# Patient Record
Sex: Female | Born: 1975
Health system: Southern US, Community
[De-identification: ages and names within clinical notes are randomized; demographics above are authoritative.]

## PROBLEM LIST (undated history)

## (undated) DIAGNOSIS — G43909 Migraine, unspecified, not intractable, without status migrainosus: Secondary | ICD-10-CM

## (undated) DIAGNOSIS — Z6828 Body mass index (BMI) 28.0-28.9, adult: Secondary | ICD-10-CM

## (undated) DIAGNOSIS — J302 Other seasonal allergic rhinitis: Secondary | ICD-10-CM

## (undated) HISTORY — DX: Other seasonal allergic rhinitis: J30.2

## (undated) HISTORY — PX: TUBAL LIGATION: SHX77

## (undated) HISTORY — DX: Body mass index (BMI) 28.0-28.9, adult: Z68.28

## (undated) HISTORY — DX: Migraine, unspecified, not intractable, without status migrainosus: G43.909

---

## 2018-04-21 NOTE — Progress Notes (Signed)
Complete Physical  Assessment and Plan:  Diagnoses and all orders for this visit:  Establishing care with new doctor, encounter for  Encounter for routine adult health examination with abnormal findings  Screening for diabetes mellitus -     Hemoglobin A1c  Screening cholesterol level -     Lipid panel  Screening for cardiovascular condition -     EKG 12-Lead  Screening for hematuria or proteinuria -     Urinalysis w microscopic + reflex cultur -     Microalbumin / creatinine urine ratio  Screening for thyroid disorder -     TSH  Vitamin D deficiency -     VITAMIN D 25 Hydroxy (Vit-D Deficiency, Fractures)  Medication management -     CBC with Differential/Platelet -     COMPLETE METABOLIC PANEL WITH GFR -     Urinalysis w microscopic + reflex cultur  Menorrhagia with regular cycle -     Ambulatory referral to Gynecology  Cervical cancer screening -     Ambulatory referral to Gynecology  Pain in thumb joint with movement, unspecified laterality -     meloxicam (MOBIC) 15 MG tablet; Take one daily with food for 2 weeks, can take with tylenol, can not take with aleve, iburpofen, then as needed daily for pain  Screening examination for pulmonary tuberculosis She understands to follow up for reading, if continued cough, hemoptysis will obtain CXR -     TB Skin Test  Need for prophylactic vaccination with tetanus-diphtheria (Td) -     Td : Tetanus/diphtheria >7yo Preservative  free  Tinea versicolor Suggested selsum blue body wash   Overweight (BMI 25.0-29.9) Long discussion about weight loss, diet, and exercise Recommended diet heavy in fruits and veggies and low in animal meats, cheeses, and dairy products, appropriate calorie intake Discussed appropriate weight for height  Follow up at next visit   Discussed med's effects and SE's. Screening labs and tests as requested with regular follow-up as recommended. Over 40 minutes of exam, counseling, chart review,  and complex, high level critical decision making was performed this visit.   Future Appointments  Date Time Provider Department Center  04/27/2019  3:00 PM Judd Gaudier, NP [facility] None    HPI  42 y.o. female, immigrated from [place], now is a citizen, married with 2 nearly grown children, Presents to establish care and for a complete physical. She has Menorrhagia; Tinea versicolor; Overweight (BMI 25.0-29.9); and Chronic thumb pain, bilateral on their problem list.   She reports seasonal allergies, typically during fall and winter for which she takes OTC medication PRN. She reports a mild cough that began this AM, and demonstrates a tissue with scant flegm and 2 spots of blood. She denies sore throat, fever/chills, congestion, dyspnea, chest pain, night sweats.   Hx of migraines, L sided, without aura but with photosensitivity, reports last was 3 months ago, have improved recently and typically well controlled by PRN ibuprofen 800 mg TID.    She has some problems with insomnia, difficulty falling asleep, will often take ~2 hours to fall asleep. 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 She has tried melatonin but has not tried benadryl.    She reports ongoing menorrhagia with regular cycle; reports historically had cycles lastin 10-12 days, recently more normal with 7 day cycle but very heavy bleeding x 4 days. Last GYN evaluation was remote.   BMI is Body mass index is 27.11 kg/m., she has been  working on diet and exercise, she avoids sweets, cut sugar out of coffee, watching portions. No weight goal, just wants to lose weight. No alcohol intake.  Wt Readings from Last 3 Encounters:  04/22/18 134 lb 3.2 oz (60.9 kg)   Today their BP is BP: 104/64 She does workout. She denies chest pain, shortness of breath, dizziness.     Current Medications:  Current Outpatient Medications on File Prior to Visit  Medication Sig Dispense Refill  . BIOTIN PO  Take by mouth. Takes 2 gummies daily.    . Multiple Vitamins-Minerals (CENTRUM PO) Take by mouth. Takes 2 daily    . multivitamin-lutein (OCUVITE-LUTEIN) CAPS capsule Take 1 capsule by mouth daily.    . Omega-3 Fatty Acids (OMEGA-3 FISH OIL PO) Take 1,040 mg by mouth daily.    Marland Kitchen Ubiquinol (QUNOL COQ10/UBIQUINOL/MEGA) 100 MG CAPS Take 1 capsule by mouth daily.     No current facility-administered medications on file prior to visit.    Allergies:  Allergies not on file Medical History:  She has Menorrhagia; Tinea versicolor; Overweight (BMI 25.0-29.9); and Chronic thumb pain, bilateral on their problem list. Health Maintenance:   Immunization History  Administered Date(s) Administered  . PPD Test 04/22/2018  . Td 04/22/2018    Tetanus: ? 2009,  Pneumovax: -  Flu vaccine: 2019  LMP: Patient's last menstrual period was 04/07/2018 (exact date). Pap: Remote MGM: never   Colonoscopy: never EGD:  Last Dental Exam: Last 2017 Last Eye Exam: Last 2017  Patient Care Team: Lucky Cowboy, MD as PCP - General (Internal Medicine)  Surgical History:  She has a past surgical history that includes Cesarean section and Tubal ligation (Bilateral). Family History:  Herfamily history includes Anemia in her mother; Diabetes in her father, paternal grandfather, and paternal grandmother; Heart disease in her father; Kidney Stones in her father; Migraines in her mother; Obesity in her father. Social History:  She reports that she has never smoked. She has never used smokeless tobacco. She reports that she does not drink alcohol or use drugs.  Review of Systems: Review of Systems  Constitutional: Negative for chills, fever, malaise/fatigue and weight loss.  HENT: Negative for congestion, hearing loss, nosebleeds, sinus pain, sore throat and tinnitus.   Eyes: Negative for blurred vision and double vision.  Respiratory: Positive for cough and hemoptysis (scant, started this AM). Negative for  sputum production, shortness of breath and wheezing.   Cardiovascular: Negative for chest pain, palpitations, orthopnea, claudication and leg swelling.  Gastrointestinal: Negative for abdominal pain, blood in stool, constipation, diarrhea, heartburn, melena, nausea and vomiting.  Genitourinary: Negative.   Musculoskeletal: Positive for joint pain (bilateral thumbs, bilateral knees). Negative for back pain, myalgias and neck pain.  Skin: Negative for rash.  Neurological: Negative for dizziness, tingling, sensory change, weakness and headaches.  Endo/Heme/Allergies: Positive for environmental allergies. Negative for polydipsia.  Psychiatric/Behavioral: Negative for depression, memory loss, substance abuse and suicidal ideas. The patient has insomnia. The patient is not nervous/anxious.   All other systems reviewed and are negative.   Physical Exam: Estimated body mass index is 27.11 kg/m as calculated from the following:   Height as of this encounter: 4\' 11"  (1.499 m).   Weight as of this encounter: 134 lb 3.2 oz (60.9 kg). BP 104/64   Pulse 64   Temp (!) 97.3 F (36.3 C)   Resp 16   Ht 4\' 11"  (1.499 m)   Wt 134 lb 3.2 oz (60.9 kg)   LMP 04/07/2018 (Exact Date)  BMI 27.11 kg/m  General Appearance: Well nourished, in no apparent distress.  Eyes: PERRLA, EOMs, conjunctiva no swelling or erythema, fundal exam deferred ENT/Mouth: Ext aud canals clear, normal light reflex with TMs without erythema, bulging. Good dentition. No erythema, swelling, or exudate on post pharynx. Tonsils not swollen or erythematous. Hearing normal.  Neck: Supple, thyroid normal. No bruits  Respiratory: Respiratory effort normal, BS with scant coarse bronchial sounds of R base without rales, rhonchi, wheezing or stridor.  Cardio: RRR without murmurs, rubs or gallops. Brisk peripheral pulses without edema.  Chest: symmetric, with normal excursions and percussion.  Breasts: Refer to GYN Abdomen: Soft, nontender,  no guarding, rebound, hernias, masses, or organomegaly.  Lymphatics: Non tender without lymphadenopathy.  Genitourinary: Defer, insufficient time, refer to GYN Musculoskeletal: Full ROM all peripheral extremities, bilateral knees with crepitus, no effusion, heat, laxity, 5/5 strength throughout, and normal gait.  Skin: Warm, dry without  Ecchymosis. She has scattered small round areas of skin without melanin light color contrasting brown skin) to bilateral hands/wrists/lower legs. Not raised, no discharge.  Neuro: Cranial nerves intact, reflexes equal bilaterally. Normal muscle tone, no cerebellar symptoms. Sensation intact.  Psych: Awake and oriented X 3, normal affect, Insight and Judgment appropriate.   EKG: WNL no ST changes.   Carlyon ShadowAshley C Dianna Ewald 6:03 PM [facility]

## 2018-04-22 ENCOUNTER — Ambulatory Visit (INDEPENDENT_AMBULATORY_CARE_PROVIDER_SITE_OTHER): Payer: BLUE CROSS/BLUE SHIELD | Admitting: Adult Health

## 2018-04-22 ENCOUNTER — Encounter: Payer: Self-pay | Admitting: Adult Health

## 2018-04-22 VITALS — BP 104/64 | HR 64 | Temp 97.3°F | Resp 16 | Ht 59.0 in | Wt 134.2 lb

## 2018-04-22 DIAGNOSIS — Z111 Encounter for screening for respiratory tuberculosis: Secondary | ICD-10-CM | POA: Diagnosis not present

## 2018-04-22 DIAGNOSIS — E559 Vitamin D deficiency, unspecified: Secondary | ICD-10-CM | POA: Diagnosis not present

## 2018-04-22 DIAGNOSIS — Z131 Encounter for screening for diabetes mellitus: Secondary | ICD-10-CM

## 2018-04-22 DIAGNOSIS — Z79899 Other long term (current) drug therapy: Secondary | ICD-10-CM

## 2018-04-22 DIAGNOSIS — M79645 Pain in left finger(s): Secondary | ICD-10-CM

## 2018-04-22 DIAGNOSIS — Z1389 Encounter for screening for other disorder: Secondary | ICD-10-CM | POA: Diagnosis not present

## 2018-04-22 DIAGNOSIS — N924 Excessive bleeding in the premenopausal period: Secondary | ICD-10-CM

## 2018-04-22 DIAGNOSIS — Z23 Encounter for immunization: Secondary | ICD-10-CM | POA: Diagnosis not present

## 2018-04-22 DIAGNOSIS — M79646 Pain in unspecified finger(s): Secondary | ICD-10-CM

## 2018-04-22 DIAGNOSIS — Z1329 Encounter for screening for other suspected endocrine disorder: Secondary | ICD-10-CM

## 2018-04-22 DIAGNOSIS — Z13220 Encounter for screening for lipoid disorders: Secondary | ICD-10-CM

## 2018-04-22 DIAGNOSIS — M25549 Pain in joints of unspecified hand: Secondary | ICD-10-CM

## 2018-04-22 DIAGNOSIS — G8929 Other chronic pain: Secondary | ICD-10-CM | POA: Insufficient documentation

## 2018-04-22 DIAGNOSIS — I10 Essential (primary) hypertension: Secondary | ICD-10-CM

## 2018-04-22 DIAGNOSIS — Z136 Encounter for screening for cardiovascular disorders: Secondary | ICD-10-CM | POA: Diagnosis not present

## 2018-04-22 DIAGNOSIS — E663 Overweight: Secondary | ICD-10-CM

## 2018-04-22 DIAGNOSIS — N920 Excessive and frequent menstruation with regular cycle: Secondary | ICD-10-CM | POA: Insufficient documentation

## 2018-04-22 DIAGNOSIS — Z0000 Encounter for general adult medical examination without abnormal findings: Secondary | ICD-10-CM

## 2018-04-22 DIAGNOSIS — Z7689 Persons encountering health services in other specified circumstances: Secondary | ICD-10-CM

## 2018-04-22 DIAGNOSIS — Z124 Encounter for screening for malignant neoplasm of cervix: Secondary | ICD-10-CM

## 2018-04-22 DIAGNOSIS — B360 Pityriasis versicolor: Secondary | ICD-10-CM | POA: Insufficient documentation

## 2018-04-22 DIAGNOSIS — M79644 Pain in right finger(s): Secondary | ICD-10-CM | POA: Insufficient documentation

## 2018-04-22 MED ORDER — MELOXICAM 15 MG PO TABS: ORAL_TABLET | ORAL | 1 refills | Status: DC

## 2018-04-22 NOTE — Patient Instructions (Addendum)
Try using selsun blue body wash or shampoo as body wash - this has medication in it to help with the skin fungus and typically is very helpful.    Tinea Versicolor Tinea versicolor is a common fungal infection of the skin. It causes a rash that appears as light or dark patches on the skin. The rash most often occurs on the chest, back, neck, or upper arms. This condition is more common during warm weather. Other than affecting how your skin looks, tinea versicolor usually does not cause other problems. In most cases, the infection goes away in a few weeks with treatment. It may take a few months for the patches on your skin to clear up. What are the causes? Tinea versicolor occurs when a type of fungus that is normally present on the skin starts to overgrow. This fungus is a kind of yeast. The exact cause of the overgrowth is not known. This condition cannot be passed from one person to another (noncontagious). What increases the risk? This condition is more likely to develop when certain factors are present, such as:  Heat and humidity.  Sweating too much.  Hormone changes.  Oily skin.  A weak defense (immune) system.  What are the signs or symptoms? Symptoms of this condition may include:  A rash on your skin that is made up of light or dark patches. The rash may have: ? Patches of tan or pink spots on light skin. ? Patches of white or brown spots on dark skin. ? Patches of skin that do not tan. ? Well-marked edges. ? Scales on the discolored areas.  Mild itching.  How is this diagnosed? A health care provider can usually diagnose this condition by looking at your skin. During the exam, he or she may use ultraviolet light to help determine the extent of the infection. In some cases, a skin sample may be taken by scraping the rash. This sample will be viewed under a microscope to check for yeast overgrowth. How is this treated? Treatment for this condition may  include:  Dandruff shampoo that is applied to the affected skin during showers or bathing.  Over-the-counter medicated skin cream, lotion, or soaps.  Prescription antifungal medicine in the form of skin cream or pills.  Medicine to help reduce itching.  Follow these instructions at home:  Take medicines only as directed by your health care provider.  Apply dandruff shampoo to the affected area if told to do so by your health care provider. You may be instructed to scrub the affected skin for several minutes each day.  Do not scratch the affected area of skin.  Avoid hot and humid conditions.  Do not use tanning booths.  Try to avoid sweating a lot. Contact a health care provider if:  Your symptoms get worse.  You have a fever.  You have redness, swelling, or pain at the site of your rash.  You have fluid, blood, or pus coming from your rash.  Your rash returns after treatment. This information is not intended to replace advice given to you by your health care provider. Make sure you discuss any questions you have with your health care provider. Document Released: 05/16/2000 Document Revised: 01/20/2016 Document Reviewed: 02/28/2014 Elsevier Interactive Patient Education  2018 [employer].   Meloxicam tablets What is this medicine? MELOXICAM (mel OX i cam) is a non-steroidal anti-inflammatory drug (NSAID). It is used to reduce swelling and to treat pain. It may be used for osteoarthritis, rheumatoid arthritis,  or juvenile rheumatoid arthritis. This medicine may be used for other purposes; ask your health care provider or pharmacist if you have questions. COMMON BRAND NAME(S): Mobic What should I tell my health care provider before I take this medicine? They need to know if you have any of these conditions: -bleeding disorders -cigarette smoker -coronary artery bypass graft (CABG) surgery within the past 2 weeks -drink more than 3 alcohol-containing drinks per  day -heart disease -high blood pressure -history of stomach bleeding -kidney disease -liver disease -lung or breathing disease, like asthma -stomach or intestine problems -an unusual or allergic reaction to meloxicam, aspirin, other NSAIDs, other medicines, foods, dyes, or preservatives -pregnant or trying to get pregnant -breast-feeding How should I use this medicine? Take this medicine by mouth with a full glass of water. Follow the directions on the prescription label. You can take it with or without food. If it upsets your stomach, take it with food. Take your medicine at regular intervals. Do not take it more often than directed. Do not stop taking except on your doctor's advice. A special MedGuide will be given to you by the pharmacist with each prescription and refill. Be sure to read this information carefully each time. Talk to your pediatrician regarding the use of this medicine in children. While this drug may be prescribed for selected conditions, precautions do apply. Patients over 41 years old may have a stronger reaction and need a smaller dose. Overdosage: If you think you have taken too much of this medicine contact a poison control center or emergency room at once. NOTE: This medicine is only for you. Do not share this medicine with others. What if I miss a dose? If you miss a dose, take it as soon as you can. If it is almost time for your next dose, take only that dose. Do not take double or extra doses. What may interact with this medicine? Do not take this medicine with any of the following medications: -cidofovir -ketorolac This medicine may also interact with the following medications: -aspirin and aspirin-like medicines -certain medicines for blood pressure, heart disease, irregular heart beat -certain medicines for depression, anxiety, or psychotic disturbances -certain medicines that treat or prevent blood clots like warfarin, enoxaparin, dalteparin, apixaban,  dabigatran, rivaroxaban -cyclosporine -digoxin -diuretics -methotrexate -other NSAIDs, medicines for pain and inflammation, like ibuprofen and naproxen -pemetrexed This list may not describe all possible interactions. Give your health care provider a list of all the medicines, herbs, non-prescription drugs, or dietary supplements you use. Also tell them if you smoke, drink alcohol, or use illegal drugs. Some items may interact with your medicine. What should I watch for while using this medicine? Tell your doctor or healthcare professional if your symptoms do not start to get better or if they get worse. Do not take other medicines that contain aspirin, ibuprofen, or naproxen with this medicine. Side effects such as stomach upset, nausea, or ulcers may be more likely to occur. Many medicines available without a prescription should not be taken with this medicine. This medicine can cause ulcers and bleeding in the stomach and intestines at any time during treatment. This can happen with no warning and may cause death. There is increased risk with taking this medicine for a long time. Smoking, drinking alcohol, older age, and poor health can also increase risks. Call your doctor right away if you have stomach pain or blood in your vomit or stool. This medicine does not prevent heart attack or stroke. In  fact, this medicine may increase the chance of a heart attack or stroke. The chance may increase with longer use of this medicine and in people who have heart disease. If you take aspirin to prevent heart attack or stroke, talk with your doctor or health care professional. What side effects may I notice from receiving this medicine? Side effects that you should report to your doctor or health care professional as soon as possible: -allergic reactions like skin rash, itching or hives, swelling of the face, lips, or tongue -nausea, vomiting -signs and symptoms of a blood clot such as breathing problems;  changes in vision; chest pain; severe, sudden headache; pain, swelling, warmth in the leg; trouble speaking; sudden numbness or weakness of the face, arm, or leg -signs and symptoms of bleeding such as bloody or black, tarry stools; red or dark-brown urine; spitting up blood or brown material that looks like coffee grounds; red spots on the skin; unusual bruising or bleeding from the eye, gums, or nose -signs and symptoms of liver injury like dark yellow or brown urine; general ill feeling or flu-like symptoms; light-colored stools; loss of appetite; nausea; right upper belly pain; unusually weak or tired; yellowing of the eyes or skin -signs and symptoms of stroke like changes in vision; confusion; trouble speaking or understanding; severe headaches; sudden numbness or weakness of the face, arm, or leg; trouble walking; dizziness; loss of balance or coordination Side effects that usually do not require medical attention (report to your doctor or health care professional if they continue or are bothersome): -constipation -diarrhea -gas This list may not describe all possible side effects. Call your doctor for medical advice about side effects. You may report side effects to FDA at 1-800-FDA-1088. Where should I keep my medicine? Keep out of the reach of children. Store at room temperature between 15 and 30 degrees C (59 and 86 degrees F). Throw away any unused medicine after the expiration date. NOTE: This sheet is a summary. It may not cover all possible information. If you have questions about this medicine, talk to your doctor, pharmacist, or health care provider.  2018 Elsevier/Gold Standard (2015-06-20 19:28:16)

## 2018-04-23 ENCOUNTER — Encounter: Payer: Self-pay | Admitting: Adult Health

## 2018-04-23 DIAGNOSIS — R7309 Other abnormal glucose: Secondary | ICD-10-CM | POA: Insufficient documentation

## 2018-04-23 DIAGNOSIS — E785 Hyperlipidemia, unspecified: Secondary | ICD-10-CM | POA: Insufficient documentation

## 2018-04-23 DIAGNOSIS — R7303 Prediabetes: Secondary | ICD-10-CM

## 2018-04-23 DIAGNOSIS — E559 Vitamin D deficiency, unspecified: Secondary | ICD-10-CM | POA: Insufficient documentation

## 2018-04-23 DIAGNOSIS — Z87898 Personal history of other specified conditions: Secondary | ICD-10-CM

## 2018-04-23 HISTORY — DX: Personal history of other specified conditions: Z87.898

## 2018-04-23 LAB — URINALYSIS W MICROSCOPIC + REFLEX CULTURE
Bacteria, UA: NONE SEEN /HPF
Bilirubin Urine: NEGATIVE
Glucose, UA: NEGATIVE
Hgb urine dipstick: NEGATIVE
Hyaline Cast: NONE SEEN /LPF
Ketones, ur: NEGATIVE
Leukocyte Esterase: NEGATIVE
NITRITES URINE, INITIAL: NEGATIVE
Protein, ur: NEGATIVE
SPECIFIC GRAVITY, URINE: 1.021 (ref 1.001–1.03)
Squamous Epithelial / LPF: NONE SEEN /HPF (ref <=5)
WBC UA: NONE SEEN /HPF (ref 0–5)
pH: 7 (ref 5.0–8.0)

## 2018-04-23 LAB — COMPLETE METABOLIC PANEL WITH GFR
AG Ratio: 1.2 (calc) (ref 1.0–2.5)
ALBUMIN MSPROF: 4.2 g/dL (ref 3.6–5.1)
ALKALINE PHOSPHATASE (APISO): 53 U/L (ref 33–115)
ALT: 10 U/L (ref 6–29)
AST: 15 U/L (ref 10–30)
BILIRUBIN TOTAL: 0.2 mg/dL (ref 0.2–1.2)
BUN: 11 mg/dL (ref 7–25)
CALCIUM: 9.3 mg/dL (ref 8.6–10.2)
CHLORIDE: 104 mmol/L (ref 98–110)
CO2: 26 mmol/L (ref 20–32)
CREATININE: 0.61 mg/dL (ref 0.50–1.10)
GFR, Est African American: 131 mL/min/{1.73_m2} (ref >=60)
GFR, Est Non African American: 113 mL/min/{1.73_m2} (ref >=60)
GLUCOSE: 85 mg/dL (ref 65–99)
Globulin: 3.4 g/dL (calc) (ref 1.9–3.7)
Potassium: 4.6 mmol/L (ref 3.5–5.3)
Sodium: 139 mmol/L (ref 135–146)
TOTAL PROTEIN: 7.6 g/dL (ref 6.1–8.1)

## 2018-04-23 LAB — CBC WITH DIFFERENTIAL/PLATELET
BASOS ABS: 73 {cells}/uL (ref 0–200)
BASOS PCT: 1 %
Eosinophils Absolute: 102 cells/uL (ref 15–500)
Eosinophils Relative: 1.4 %
HCT: 37.8 % (ref 35.0–45.0)
HEMOGLOBIN: 12.5 g/dL (ref 11.7–15.5)
Lymphs Abs: 2621 cells/uL (ref 850–3900)
MCH: 26.8 pg — ABNORMAL LOW (ref 27.0–33.0)
MCHC: 33.1 g/dL (ref 32.0–36.0)
MCV: 80.9 fL (ref 80.0–100.0)
MONOS PCT: 6 %
MPV: 10.9 fL (ref 7.5–12.5)
NEUTROS ABS: 4066 {cells}/uL (ref 1500–7800)
Neutrophils Relative %: 55.7 %
Platelets: 304 10*3/uL (ref 140–400)
RBC: 4.67 10*6/uL (ref 3.80–5.10)
RDW: 13 % (ref 11.0–15.0)
Total Lymphocyte: 35.9 %
WBC mixed population: 438 cells/uL (ref 200–950)
WBC: 7.3 10*3/uL (ref 3.8–10.8)

## 2018-04-23 LAB — LIPID PANEL
CHOL/HDL RATIO: 3.7 (calc) (ref <5.0)
CHOLESTEROL: 209 mg/dL — AB (ref <200)
HDL: 56 mg/dL (ref >50)
LDL Cholesterol (Calc): 124 mg/dL (calc) — ABNORMAL HIGH
Non-HDL Cholesterol (Calc): 153 mg/dL (calc) — ABNORMAL HIGH (ref <130)
Triglycerides: 172 mg/dL — ABNORMAL HIGH (ref <150)

## 2018-04-23 LAB — HEMOGLOBIN A1C
HEMOGLOBIN A1C: 5.8 %{Hb} — AB (ref <5.7)
Mean Plasma Glucose: 120 (calc)
eAG (mmol/L): 6.6 (calc)

## 2018-04-23 LAB — TSH: TSH: 2.19 mIU/L

## 2018-04-23 LAB — NO CULTURE INDICATED

## 2018-04-23 LAB — MICROALBUMIN / CREATININE URINE RATIO
CREATININE, URINE: 122 mg/dL (ref 20–275)
MICROALB UR: 1.1 mg/dL
Microalb Creat Ratio: 9 mcg/mg creat (ref <30)

## 2018-04-23 LAB — VITAMIN D 25 HYDROXY (VIT D DEFICIENCY, FRACTURES): VIT D 25 HYDROXY: 27 ng/mL — AB (ref 30–100)

## 2018-05-20 ENCOUNTER — Encounter: Payer: Self-pay | Admitting: Gynecology

## 2018-05-20 ENCOUNTER — Ambulatory Visit (INDEPENDENT_AMBULATORY_CARE_PROVIDER_SITE_OTHER): Payer: BLUE CROSS/BLUE SHIELD | Admitting: Gynecology

## 2018-05-20 VITALS — BP 110/62 | Ht <= 58 in | Wt 133.0 lb

## 2018-05-20 DIAGNOSIS — Z1151 Encounter for screening for human papillomavirus (HPV): Secondary | ICD-10-CM

## 2018-05-20 DIAGNOSIS — Z01419 Encounter for gynecological examination (general) (routine) without abnormal findings: Secondary | ICD-10-CM

## 2018-05-20 DIAGNOSIS — N920 Excessive and frequent menstruation with regular cycle: Secondary | ICD-10-CM

## 2018-05-20 NOTE — Patient Instructions (Addendum)
Follow-up for ultrasound as scheduled.   Call to Schedule your mammogram  Facilities in [place]: 1)  [facility]. [facility], [address] Phone: [phone] 2)  Dr. Bertrand at [facility]  [address] Phone: [phone]     Mammogram A mammogram is an X-ray test to find changes in a woman's breast. You should get a mammogram if:  You are 40 years of age or older  You have risk factors.   Your doctor recommends that you have one.  BEFORE THE TEST  Do not schedule the test the week before your period, especially if your breasts are sore during this time.  On the day of your mammogram:  Wash your breasts and armpits well. After washing, do not put on any deodorant or talcum powder on until after your test.   Eat and drink as you usually do.   Take your medicines as usual.   If you are diabetic and take insulin, make sure you:   Eat before coming for your test.   Take your insulin as usual.   If you cannot keep your appointment, call before the appointment to cancel. Schedule another appointment.  TEST  You will need to undress from the waist up. You will put on a hospital gown.   Your breast will be put on the mammogram machine, and it will press firmly on your breast with a piece of plastic called a compression paddle. This will make your breast flatter so that the machine can X-ray all parts of your breast.   Both breasts will be X-rayed. Each breast will be X-rayed from above and from the side. An X-ray might need to be taken again if the picture is not good enough.   The mammogram will last about 15 to 30 minutes.  AFTER THE TEST Finding out the results of your test Ask when your test results will be ready. Make sure you get your test results.  Document Released: 08/15/2008 Document Revised: 05/08/2011 Document Reviewed: 08/15/2008 ExitCare Patient Information 2012 [employer].   

## 2018-05-20 NOTE — Addendum Note (Signed)
Addended by: Dayna BarkerGARDNER, Keeghan Bialy K on: 05/20/2018 04:09 PM   Modules accepted: Orders

## 2018-05-20 NOTE — Progress Notes (Signed)
    Sherri Browneratthinee Anthony 19-Mar-1976 [id number]        42 y.o.  G2P2 new patient for annual gynecologic exam.  Is that her menses are heavy lasting 7 days with occasional bleedthrough episodes.  Are regular monthly without intermenstrual bleeding.  Have always seem to be this way.  Status post C-section x2 with BTL.  No history of abnormal Pap smears.  Last GYN checkup a number of years ago.  Past medical history,surgical history, problem list, medications, allergies, family history and social history were all reviewed and documented as reviewed in the EPIC chart.  ROS:  Performed with pertinent positives and negatives included in the history, assessment and plan.   Additional significant findings : None   Exam: Kennon PortelaKim Gardner assistant Vitals:   05/20/18 1518  Weight: 133 lb (60.3 kg)  Height: 4\' 10"  (1.473 m)   Body mass index is 27.8 kg/m. BP 110/62  General appearance:  Normal affect, orientation and appearance. Skin: Grossly normal HEENT: Without gross lesions.  No cervical or supraclavicular adenopathy. Thyroid normal.  Lungs:  Clear without wheezing, rales or rhonchi Cardiac: RR, without RMG Abdominal:  Soft, nontender, without masses, guarding, rebound, organomegaly or hernia Breasts:  Examined lying and sitting without masses, retractions, discharge or axillary adenopathy. Pelvic:  Ext, BUS, Vagina: Normal  Cervix: Normal.  Pap smear/HPV  Uterus: Retroverted, normal size, shape and contour, midline and mobile nontender   Adnexa: Without masses or tenderness    Anus and perineum: Normal   Rectovaginal: Normal sphincter tone without palpated masses or tenderness.    Assessment/Plan:  42 y.o. G2P2 female for annual gynecologic exam with regular menses, tubal sterilization.   1. Menorrhagia.  Exam is normal.  Discussed differential to include hormonal dysfunction, adenomyosis, structural such as small myomas or polyps.  Recommend start with sonohysterogram for better uterine  definition and endometrial assessment.  Various scenarios to include hormonal manipulation, Mirena IUD, hysteroscopic resection of defects encountered, endometrial ablation, hysterectomy all reviewed.  Will rediscuss after ultrasound.  Recent hemoglobin was 12 and a normal TSH. 2. Mammogram a number of years ago.  Recommend baseline mammogram now.  Names and numbers provided.  Breast exam normal today. 3. Pap smear a number of years ago.  Pap smear/HPV done today.  No history of abnormal Pap smears previously. 4. Health maintenance.  No routine lab work done as patient recently had this done at her primary physician's office.  Follow-up for sonohysterogram.  Follow-up in 1 year for annual exam.   Dara Lordsimothy P Fontaine MD, 4:00 PM 05/20/2018

## 2018-05-21 LAB — PAP IG AND HPV HIGH-RISK: HPV DNA HIGH RISK: NOT DETECTED

## 2018-07-01 ENCOUNTER — Other Ambulatory Visit: Payer: BLUE CROSS/BLUE SHIELD

## 2018-07-01 ENCOUNTER — Ambulatory Visit: Payer: BLUE CROSS/BLUE SHIELD | Admitting: Gynecology

## 2018-08-25 ENCOUNTER — Other Ambulatory Visit: Payer: Self-pay

## 2018-08-25 DIAGNOSIS — M25549 Pain in joints of unspecified hand: Secondary | ICD-10-CM

## 2018-08-25 MED ORDER — MELOXICAM 15 MG PO TABS: ORAL_TABLET | ORAL | 1 refills | Status: DC

## 2018-08-25 NOTE — Telephone Encounter (Signed)
Meloxicam refill request

## 2018-10-21 ENCOUNTER — Other Ambulatory Visit: Payer: Self-pay

## 2018-10-21 ENCOUNTER — Ambulatory Visit (INDEPENDENT_AMBULATORY_CARE_PROVIDER_SITE_OTHER): Payer: BLUE CROSS/BLUE SHIELD | Admitting: Adult Health

## 2018-10-21 ENCOUNTER — Encounter: Payer: Self-pay | Admitting: Adult Health

## 2018-10-21 VITALS — BP 110/70 | HR 81 | Temp 96.1°F | Wt 133.2 lb

## 2018-10-21 DIAGNOSIS — E785 Hyperlipidemia, unspecified: Secondary | ICD-10-CM | POA: Diagnosis not present

## 2018-10-21 DIAGNOSIS — Z79899 Other long term (current) drug therapy: Secondary | ICD-10-CM | POA: Diagnosis not present

## 2018-10-21 DIAGNOSIS — E663 Overweight: Secondary | ICD-10-CM

## 2018-10-21 DIAGNOSIS — D509 Iron deficiency anemia, unspecified: Secondary | ICD-10-CM

## 2018-10-21 DIAGNOSIS — R7309 Other abnormal glucose: Secondary | ICD-10-CM

## 2018-10-21 NOTE — Progress Notes (Signed)
FOLLOW UP  Assessment and Plan:   Sherri Anthony was seen today for follow-up.  Diagnoses and all orders for this visit:  Other abnormal glucose Discussed disease and risks Discussed diet/exercise, weight management  -     Hemoglobin A1c  Hyperlipidemia, unspecified hyperlipidemia type Mild elevations not requiring statin therapy; working on lifestyle Diet reviewed in detail today Continue low cholesterol diet and exercise.  Check lipid panel.  -     Lipid panel  Overweight (BMI 25.0-29.9) Long discussion about weight loss, diet, and exercise Recommended diet heavy in fruits and veggies and low in animal meats, cheeses, and dairy products, appropriate calorie intake Patient will work on watching rice portions, choose brown over white more frequently, try walking 15 min daily  Discussed appropriate weight for height  Follow up at next visit  Medication management -     CBC with Differential/Platelet -     COMPLETE METABOLIC PANEL WITH GFR  Iron deficiency anemia, unspecified iron deficiency anemia type Continue on iron supplement as directed; menorrhagia followed by GYN -     CBC with Differential/Platelet   Continue diet and meds as discussed. Further disposition pending results of labs. Discussed med's effects and SE's.   Over 30 minutes of exam, counseling, chart review, and critical decision making was performed.   Future Appointments  Date Time Provider Department Center  04/27/2019  3:00 PM Judd Gaudier, NP [facility] None    ----------------------------------------------------------------------------------------------------------------------  HPI 43 y.o. female, immigrant citizen from [place], works in [employer], presents accompanied by her husband for 6 month follow up on cholesterol, diabetes, weight and vitamin D deficiency.   She was seen at [facility] where anemia was noted; last CBC in our office was normal; she has been taking unknown iron 65 mg TID x 1  month, was advised 3 month duration. Denies dizziness, fatigue, though endorses occasionally feeling short of breath with exertion. She has menorrhagia following with GYN.   BMI is Body mass index is 27.84 kg/m., she has been working on diet, she reports she does 15 min of body weight and sit ups daily. She works a lot in her garden but doesn't walk intentionally.  Wt Readings from Last 3 Encounters:  10/21/18 133 lb 3.2 oz (60.4 kg)  05/20/18 133 lb (60.3 kg)  04/22/18 134 lb 3.2 oz (60.9 kg)    Today their BP is BP: 110/70  She does workout. She denies chest pain, shortness of breath, dizziness.   She is not on cholesterol medication, working on lifestyle, pushing vegetable intake, uses vegetable intake. She does eat all types of meat. Her cholesterol is not at goal. The cholesterol last visit was:   Lab Results  Component Value Date   CHOL 209 (H) 04/22/2018   HDL 56 04/22/2018   LDLCALC 124 (H) 04/22/2018   TRIG 172 (H) 04/22/2018   CHOLHDL 3.7 04/22/2018    She has been working on diet and exercise for prediabetes, and denies increased appetite, nausea, paresthesia of the feet, polydipsia, polyuria and visual disturbances. Last A1C in the office was:  Lab Results  Component Value Date   HGBA1C 5.8 (H) 04/22/2018    Patient is on Vitamin D supplement.   Lab Results  Component Value Date   VD25OH 27 (L) 04/22/2018         Current Medications:  Current Outpatient Medications on File Prior to Visit  Medication Sig  . BIOTIN PO Take by mouth. Takes 2 gummies daily.  . cyclobenzaprine (FLEXERIL) 5  MG tablet Take 5 mg by mouth 3 (three) times daily as needed for muscle spasms.  . Ferrous Sulfate (IRON) 325 (65 Fe) MG TABS Take by mouth 3 (three) times daily.  . meloxicam (MOBIC) 15 MG tablet Take one daily with food for 2 weeks, can take with tylenol, can not take with aleve, iburpofen, then as needed daily for pain  . Multiple Vitamins-Minerals (CENTRUM PO) Take by mouth.  Takes 2 daily  . multivitamin-lutein (OCUVITE-LUTEIN) CAPS capsule Take 1 capsule by mouth daily.  . Omega-3 Fatty Acids (OMEGA-3 FISH OIL PO) Take 1,040 mg by mouth daily.  Marland Kitchen. Ubiquinol (QUNOL COQ10/UBIQUINOL/MEGA) 100 MG CAPS Take 1 capsule by mouth daily.   No current facility-administered medications on file prior to visit.      Allergies: No Known Allergies   Medical History:  Past Medical History:  Diagnosis Date  . Migraines   . Seasonal allergies    Family history- Reviewed and unchanged Social history- Reviewed and unchanged   Review of Systems:  Review of Systems  Constitutional: Negative for malaise/fatigue and weight loss.  HENT: Negative for hearing loss and tinnitus.   Eyes: Negative for blurred vision and double vision.  Respiratory: Negative for cough, shortness of breath and wheezing.   Cardiovascular: Negative for chest pain, palpitations, orthopnea, claudication and leg swelling.  Gastrointestinal: Negative for abdominal pain, blood in stool, constipation, diarrhea, heartburn, melena, nausea and vomiting.  Genitourinary: Negative.   Musculoskeletal: Negative for joint pain and myalgias.  Skin: Negative for rash.  Neurological: Negative for dizziness, tingling, sensory change, weakness and headaches.  Endo/Heme/Allergies: Negative for polydipsia.  Psychiatric/Behavioral: Negative.   All other systems reviewed and are negative.    Physical Exam: BP 110/70   Pulse 81   Temp (!) 96.1 F (35.6 C)   Wt 133 lb 3.2 oz (60.4 kg)   LMP 10/16/2018   SpO2 98%   BMI 27.84 kg/m  Wt Readings from Last 3 Encounters:  10/21/18 133 lb 3.2 oz (60.4 kg)  05/20/18 133 lb (60.3 kg)  04/22/18 134 lb 3.2 oz (60.9 kg)   General Appearance: Well nourished, in no apparent distress. Eyes: PERRLA, EOMs, conjunctiva no swelling or erythema Sinuses: No Frontal/maxillary tenderness ENT/Mouth: Ext aud canals clear, TMs without erythema, bulging. No erythema, swelling, or  exudate on post pharynx.  Tonsils not swollen or erythematous. Hearing normal.  Neck: Supple, thyroid normal.  Respiratory: Respiratory effort normal, BS equal bilaterally without rales, rhonchi, wheezing or stridor.  Cardio: RRR with no MRGs. Brisk peripheral pulses without edema.  Abdomen: Soft, + BS.  Non tender, no guarding, rebound, hernias, masses. Lymphatics: Non tender without lymphadenopathy.  Musculoskeletal: Full ROM, 5/5 strength, Normal gait Skin: Warm, dry without rashes, lesions, ecchymosis.  Neuro: Cranial nerves intact. No cerebellar symptoms.  Psych: Awake and oriented X 3, normal affect, Insight and Judgment appropriate.    Dan MakerAshley C Jenice Leiner, NP 4:00 PM [facility]

## 2018-10-21 NOTE — Patient Instructions (Addendum)
Goals    . HEMOGLOBIN A1C < 5.7    . LDL CALC < 100      Lab Results  Component Value Date   CHOL 209 (H) 04/22/2018   HDL 56 04/22/2018   LDLCALC 124 (H) 04/22/2018   TRIG 172 (H) 04/22/2018   CHOLHDL 3.7 04/22/2018   Lab Results  Component Value Date   HGBA1C 5.8 (H) 04/22/2018     Preventing High Cholesterol Cholesterol is a waxy, fat-like substance that your body needs in small amounts. Your liver makes all the cholesterol that your body needs. Having high cholesterol (hypercholesterolemia) increases your risk for heart disease and stroke. Extra (excess) cholesterol comes from the food you eat, such as animal-based fat (saturated fat) from meat and some dairy products. High cholesterol can often be prevented with diet and lifestyle changes. If you already have high cholesterol, you can control it with diet and lifestyle changes, as well as medicine. What nutrition changes can be made?  Eat less saturated fat. Foods that contain saturated fat include red meat and some dairy products.  Avoid processed meats, like bacon and lunch meats.  Avoid trans fats, which are found in margarine and some baked goods.  Avoid foods and beverages that have added sugars.  Eat more fruits, vegetables, and whole grains.  Choose healthy sources of protein, such as fish, poultry, and nuts.  Choose healthy sources of fat, such as: ? Nuts. ? Vegetable oils, especially olive oil. ? Fish that have healthy fats (omega-3 fatty acids), such as mackerel or salmon. What lifestyle changes can be made?   Lose weight if you are overweight. Losing 5-10 lb (2.3-4.5 kg) can help prevent or control high cholesterol and reduce your risk for diabetes and high blood pressure. Ask your health care provider to help you with a diet and exercise plan to safely lose weight.  Get enough exercise. Do at least 150 minutes of moderate-intensity exercise each week. ? You could do this in short exercise sessions  several times a day, or you could do longer exercise sessions a few times a week. For example, you could take a brisk 10-minute walk or bike ride, 3 times a day, for 5 days a week.  Do not smoke. If you need help quitting, ask your health care provider.  Limit your alcohol intake. If you drink alcohol, limit alcohol intake to no more than 1 drink a day for nonpregnant women and 2 drinks a day for men. One drink equals 12 oz of beer, 5 oz of wine, or 1 oz of hard liquor. Why are these changes important?  If you have high cholesterol, deposits (plaques) may build up on the walls of your blood vessels. Plaques make the arteries narrower and stiffer, which can restrict or block blood flow and cause blood clots to form. This greatly increases your risk for heart attack and stroke. Making diet and lifestyle changes can reduce your risk for these life-threatening conditions. What can I do to lower my risk?  Manage your risk factors for high cholesterol. Talk with your health care provider about all of your risk factors and how to lower your risk.  Manage other conditions that you have, such as diabetes or high blood pressure (hypertension).  Have your cholesterol checked at regular intervals.  Keep all follow-up visits as told by your health care provider. This is important. How is this treated? In addition to diet and lifestyle changes, your health care provider may recommend medicines to  help lower cholesterol, such as a medicine to reduce the amount of cholesterol made in your liver. You may need medicine if:  Diet and lifestyle changes do not lower your cholesterol enough.  You have high cholesterol and other risk factors for heart disease or stroke. Take over-the-counter and prescription medicines only as told by your health care provider. Where to find more information  American Heart Association: 1122334455www.heart.org/HEARTORG/Conditions/Cholesterol/Cholesterol_UCM_001089_SubHomePage.jsp   National Heart, Lung, and Blood Institute: http://hood.com/www.nhlbi.nih.gov/health/resources/heart/heart-cholesterol-hbc-what-html Summary  High cholesterol increases your risk for heart disease and stroke. By keeping your cholesterol level low, you can reduce your risk for these conditions.  Diet and lifestyle changes are the most important steps in preventing high cholesterol.  Work with your health care provider to manage your risk factors, and have your blood tested regularly. This information is not intended to replace advice given to you by your health care provider. Make sure you discuss any questions you have with your health care provider. Document Released: 06/03/2015 Document Revised: 01/26/2016 Document Reviewed: 01/26/2016 Elsevier Interactive Patient Education  2019 Elsevier Inc.      Prediabetes Eating Plan Prediabetes is a condition that causes blood sugar (glucose) levels to be higher than normal. This increases the risk for developing diabetes. In order to prevent diabetes from developing, your health care provider may recommend a diet and other lifestyle changes to help you:  Control your blood glucose levels.  Improve your cholesterol levels.  Manage your blood pressure. Your health care provider may recommend working with a diet and nutrition specialist (dietitian) to make a meal plan that is best for you. What are tips for following this plan? Lifestyle  Set weight loss goals with the help of your health care team. It is recommended that most people with prediabetes lose 7% of their current body weight.  Exercise for at least 30 minutes at least 5 days a week.  Attend a support group or seek ongoing support from a mental health counselor.  Take over-the-counter and prescription medicines only as told by your health care provider. Reading food labels  Read food labels to check the amount of fat, salt (sodium), and sugar in prepackaged foods. Avoid foods that have: ?  Saturated fats. ? Trans fats. ? Added sugars.  Avoid foods that have more than 300 milligrams (mg) of sodium per serving. Limit your daily sodium intake to less than 2,300 mg each day. Shopping  Avoid buying pre-made and processed foods. Cooking  Cook with olive oil. Do not use butter, lard, or ghee.  Bake, broil, grill, or boil foods. Avoid frying. Meal planning   Work with your dietitian to develop an eating plan that is right for you. This may include: ? Tracking how many calories you take in. Use a food diary, notebook, or mobile application to track what you eat at each meal. ? Using the glycemic index (GI) to plan your meals. The index tells you how quickly a food will raise your blood glucose. Choose low-GI foods. These foods take a longer time to raise blood glucose.  Consider following a Mediterranean diet. This diet includes: ? Several servings each day of fresh fruits and vegetables. ? Eating fish at least twice a week. ? Several servings each day of whole grains, beans, nuts, and seeds. ? Using olive oil instead of other fats. ? Moderate alcohol consumption. ? Eating small amounts of red meat and whole-fat dairy.  If you have high blood pressure, you may need to limit your sodium intake or  follow a diet such as the DASH eating plan. DASH is an eating plan that aims to lower high blood pressure. What foods are recommended? The items listed below may not be a complete list. Talk with your dietitian about what dietary choices are best for you. Grains Whole grains, such as whole-wheat or whole-grain breads, crackers, cereals, and pasta. Unsweetened oatmeal. Bulgur. Barley. Quinoa. Brown rice. Corn or whole-wheat flour tortillas or taco shells. Vegetables Lettuce. Spinach. Peas. Beets. Cauliflower. Cabbage. Broccoli. Carrots. Tomatoes. Squash. Eggplant. Herbs. Peppers. Onions. Cucumbers. Brussels sprouts. Fruits Berries. Bananas. Apples. Oranges. Grapes. Papaya. Mango.  Pomegranate. Kiwi. Grapefruit. Cherries. Meats and other protein foods Seafood. Poultry without skin. Lean cuts of pork and beef. Tofu. Eggs. Nuts. Beans. Dairy Low-fat or fat-free dairy products, such as yogurt, cottage cheese, and cheese. Beverages Water. Tea. Coffee. Sugar-free or diet soda. Seltzer water. Lowfat or no-fat milk. Milk alternatives, such as soy or almond milk. Fats and oils Olive oil. Canola oil. Sunflower oil. Grapeseed oil. Avocado. Walnuts. Sweets and desserts Sugar-free or low-fat pudding. Sugar-free or low-fat ice cream and other frozen treats. Seasoning and other foods Herbs. Sodium-free spices. Mustard. Relish. Low-fat, low-sugar ketchup. Low-fat, low-sugar barbecue sauce. Low-fat or fat-free mayonnaise. What foods are not recommended? The items listed below may not be a complete list. Talk with your dietitian about what dietary choices are best for you. Grains Refined white flour and flour products, such as bread, pasta, snack foods, and cereals. Vegetables Canned vegetables. Frozen vegetables with butter or cream sauce. Fruits Fruits canned with syrup. Meats and other protein foods Fatty cuts of meat. Poultry with skin. Breaded or fried meat. Processed meats. Dairy Full-fat yogurt, cheese, or milk. Beverages Sweetened drinks, such as sweet iced tea and soda. Fats and oils Butter. Lard. Ghee. Sweets and desserts Baked goods, such as cake, cupcakes, pastries, cookies, and cheesecake. Seasoning and other foods Spice mixes with added salt. Ketchup. Barbecue sauce. Mayonnaise. Summary  To prevent diabetes from developing, you may need to make diet and other lifestyle changes to help control blood sugar, improve cholesterol levels, and manage your blood pressure.  Set weight loss goals with the help of your health care team. It is recommended that most people with prediabetes lose 7 percent of their current body weight.  Consider following a Mediterranean  diet that includes plenty of fresh fruits and vegetables, whole grains, beans, nuts, seeds, fish, lean meat, low-fat dairy, and healthy oils. This information is not intended to replace advice given to you by your health care provider. Make sure you discuss any questions you have with your health care provider. Document Released: 10/03/2014 Document Revised: 07/23/2016 Document Reviewed: 07/23/2016 Elsevier Interactive Patient Education  2019 [employer].

## 2018-10-22 ENCOUNTER — Encounter: Payer: Self-pay | Admitting: Adult Health

## 2018-10-22 LAB — CBC WITH DIFFERENTIAL/PLATELET
Absolute Monocytes: 333 cells/uL (ref 200–950)
Basophils Absolute: 68 cells/uL (ref 0–200)
Basophils Relative: 1 %
Eosinophils Absolute: 129 cells/uL (ref 15–500)
Eosinophils Relative: 1.9 %
HCT: 40.3 % (ref 35.0–45.0)
Hemoglobin: 12.9 g/dL (ref 11.7–15.5)
Lymphs Abs: 1918 cells/uL (ref 850–3900)
MCH: 26.9 pg — ABNORMAL LOW (ref 27.0–33.0)
MCHC: 32 g/dL (ref 32.0–36.0)
MCV: 84 fL (ref 80.0–100.0)
MPV: 11.3 fL (ref 7.5–12.5)
Monocytes Relative: 4.9 %
Neutro Abs: 4352 cells/uL (ref 1500–7800)
Neutrophils Relative %: 64 %
Platelets: 283 10*3/uL (ref 140–400)
RBC: 4.8 10*6/uL (ref 3.80–5.10)
RDW: 18.5 % — ABNORMAL HIGH (ref 11.0–15.0)
Total Lymphocyte: 28.2 %
WBC: 6.8 10*3/uL (ref 3.8–10.8)

## 2018-10-22 LAB — COMPLETE METABOLIC PANEL WITH GFR
AG Ratio: 1.6 (calc) (ref 1.0–2.5)
ALT: 10 U/L (ref 6–29)
AST: 13 U/L (ref 10–30)
Albumin: 4.3 g/dL (ref 3.6–5.1)
Alkaline phosphatase (APISO): 41 U/L (ref 31–125)
BUN: 13 mg/dL (ref 7–25)
CO2: 28 mmol/L (ref 20–32)
Calcium: 9.3 mg/dL (ref 8.6–10.2)
Chloride: 105 mmol/L (ref 98–110)
Creat: 0.62 mg/dL (ref 0.50–1.10)
GFR, Est African American: 130 mL/min/{1.73_m2} (ref >=60)
GFR, Est Non African American: 112 mL/min/{1.73_m2} (ref >=60)
Globulin: 2.7 g/dL (calc) (ref 1.9–3.7)
Glucose, Bld: 99 mg/dL (ref 65–99)
Potassium: 4.3 mmol/L (ref 3.5–5.3)
Sodium: 140 mmol/L (ref 135–146)
Total Bilirubin: 0.2 mg/dL (ref 0.2–1.2)
Total Protein: 7 g/dL (ref 6.1–8.1)

## 2018-10-22 LAB — LIPID PANEL
Cholesterol: 225 mg/dL — ABNORMAL HIGH (ref <200)
HDL: 56 mg/dL (ref >=50)
LDL Cholesterol (Calc): 135 mg/dL (calc) — ABNORMAL HIGH
Non-HDL Cholesterol (Calc): 169 mg/dL (calc) — ABNORMAL HIGH (ref <130)
Total CHOL/HDL Ratio: 4 (calc) (ref <5.0)
Triglycerides: 203 mg/dL — ABNORMAL HIGH (ref <150)

## 2018-10-22 LAB — HEMOGLOBIN A1C
Hgb A1c MFr Bld: 5.2 % of total Hgb (ref <5.7)
Mean Plasma Glucose: 103 (calc)
eAG (mmol/L): 5.7 (calc)

## 2019-01-31 ENCOUNTER — Other Ambulatory Visit: Payer: Self-pay

## 2019-01-31 DIAGNOSIS — M25549 Pain in joints of unspecified hand: Secondary | ICD-10-CM

## 2019-01-31 MED ORDER — MELOXICAM 15 MG PO TABS: ORAL_TABLET | ORAL | 1 refills | Status: DC

## 2019-02-22 ENCOUNTER — Encounter: Payer: Self-pay | Admitting: Gynecology

## 2019-04-27 ENCOUNTER — Encounter: Payer: Self-pay | Admitting: Adult Health

## 2019-05-04 NOTE — Progress Notes (Signed)
Complete Physical  Assessment and Plan:  Diagnoses and all orders for this visit:  Encounter for routine adult health examination with abnormal findings  Menorrhagia with regular cycle Follows with Dr. Audie Box; declined sonotomogram due to insurance/cost  Chronic thumb pain, bilateral Follows with ortho -     meloxicam (MOBIC) 15 MG tablet; Take one daily with food for 2 weeks, can take with tylenol, can not take with aleve, iburpofen, then as needed daily for pain  Tinea versicolor Suggested selsum blue body wash PRN  Overweight (BMI 25.0-29.9) Long discussion about weight loss, diet, and exercise Recommended diet heavy in fruits and veggies and low in animal meats, cheeses, and dairy products, appropriate calorie intake Discussed appropriate weight for height  Follow up at next visit  Cholesterol Initiate medication if LDL persists 130+ Continue low cholesterol diet and exercise.  Check lipid panel.  - TSH   Iron deficiency anemia On supplement without recent check  - CBC, iron/TIBC/ferritin  Screening for diabetes mellitus -     Hemoglobin A1c  Screening for hematuria or proteinuria -     Urinalysis w microscopic + reflex cultur  Screening for thyroid disorder -     TSH  Vitamin D deficiency -     VITAMIN D 25 Hydroxy (Vit-D Deficiency, Fractures)  Medication management -     CBC with Differential/Platelet -     COMPLETE METABOLIC PANEL WITH GFR -     Magnesium   Screening breast cancer Overdue, patient agrees to have, requests we schedule  Orders Placed This Encounter  Procedures  . MM DIGITAL SCREENING BILATERAL  . CBC with Diff  . COMPLETE METABOLIC PANEL WITH GFR  . Magnesium  . Lipid Profile  . TSH  . Hemoglobin A1c ([facility])  . Vitamin D (25 hydroxy)  . Urinalysis, Routine w reflex microscopic  . Iron, TIBC and Ferritin Panel     Discussed med's effects and SE's. Screening labs and tests as requested with regular follow-up as  recommended. Over 40 minutes of exam, counseling, chart review, and complex, high level critical decision making was performed this visit.   Future Appointments  Date Time Provider Department Center  05/31/2020 11:00 AM Judd Gaudier, NP [facility] None    HPI  43 y.o. female presents for a complete physical. She has Menorrhagia; Tinea versicolor; Overweight (BMI 25.0-29.9); Chronic thumb pain, bilateral; History of prediabetes; Hyperlipidemia; and Vitamin D deficiency on their problem list.   She immigrated from [place], now is a citizen, married with 2 nearly grown children. Eldest is in Electronics engineer, living in [place] right now. [name] is 89, applying to Engineering school next year.   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 Reports sleep is improved this year.   She reports seasonal allergies, typically during fall and winter for which she takes OTC medication PRN.   Hx of migraines, L sided, without aura but with photosensitivity, reports last was 2 weeks ago, but this is rare, only had 2-3 last year, have improved recently and typically well controlled by PRN ibuprofen 800 mg TID.    She is following annually with GYN Dr. Audie Box for PAP, has reported menorrhagia; [place] sonohystogram was ordered but never completed due to insurance/cost. Hasn't had mammogram.   BMI is Body mass index is 27.38 kg/m., she has been working on diet and exercise, she avoids sweets, cut sugar out of coffee, watching portions.  No weight goal, just wants to lose weight.  No alcohol intake.  Wt Readings from Last 3 Encounters:  05/05/19 131 lb (59.4 kg)  10/21/18 133 lb 3.2 oz (60.4 kg)  05/20/18 133 lb (60.3 kg)   Today their BP is BP: 102/60 She does workout. She denies chest pain, shortness of breath, dizziness.    She is not on cholesterol medication and denies myalgias. Her cholesterol is not at goal. The cholesterol last visit was:   Lab Results  Component  Value Date   CHOL 225 (H) 10/21/2018   HDL 56 10/21/2018   LDLCALC 135 (H) 10/21/2018   TRIG 203 (H) 10/21/2018   CHOLHDL 4.0 10/21/2018    She has been working on diet and exercise for glucose management. Last A1C in the office was:  Lab Results  Component Value Date   HGBA1C 5.2 10/21/2018   Patient is on Vitamin D supplement.   Lab Results  Component Value Date   VD25OH 27 (L) 04/22/2018       Current Medications:  Current Outpatient Medications on File Prior to Visit  Medication Sig Dispense Refill  . BIOTIN PO Take by mouth. Takes 2 gummies daily.    . cyclobenzaprine (FLEXERIL) 5 MG tablet Take 5 mg by mouth 3 (three) times daily as needed for muscle spasms.    . Ferrous Sulfate (IRON) 325 (65 Fe) MG TABS Take by mouth daily.     Marland Kitchen. MAGNESIUM-ZINC PO Take 1 tablet by mouth daily.    . meloxicam (MOBIC) 15 MG tablet Take one daily with food for 2 weeks, can take with tylenol, can not take with aleve, iburpofen, then as needed daily for pain 30 tablet 1  . Multiple Vitamins-Minerals (CENTRUM PO) Take by mouth. Takes 2 daily    . multivitamin-lutein (OCUVITE-LUTEIN) CAPS capsule Take 1 capsule by mouth daily.    . Omega-3 Fatty Acids (OMEGA-3 FISH OIL PO) Take 1,040 mg by mouth daily.    Marland Kitchen. Ubiquinol (QUNOL COQ10/UBIQUINOL/MEGA) 100 MG CAPS Take 1 capsule by mouth daily.     No current facility-administered medications on file prior to visit.    Allergies:  No Known Allergies Medical History:  She has Menorrhagia; Tinea versicolor; Overweight (BMI 25.0-29.9); Chronic thumb pain, bilateral; History of prediabetes; Hyperlipidemia; and Vitamin D deficiency on their problem list. Health Maintenance:   Immunization History  Administered Date(s) Administered  . PPD Test 04/22/2018  . Td 04/22/2018    Tetanus: 2019 Pneumovax: -  Flu vaccine: 03/2019  LMP: No LMP recorded. Pap: 05/2018 - neg/no HPV MGM: never - will order and schedule  Colonoscopy: never, due age 89   EGD:  Last Dental Exam: Last 2017 - encouraged to schedule Last Eye Exam: Last 2019, glasses   Patient Care Team: Lucky CowboyMcKeown, William, MD as PCP - General (Internal Medicine)  Surgical History:  She has a past surgical history that includes Cesarean section and Tubal ligation (Bilateral). Family History:  Herfamily history includes Anemia in her mother; Diabetes in her father, paternal grandfather, and paternal grandmother; Heart disease in her father; Kidney Stones in her father; Migraines in her mother; Obesity in her father. Social History:  She reports that she has never smoked. She has never used smokeless tobacco. She reports that she does not drink alcohol or use drugs.  Review of Systems: Review of Systems  Constitutional: Negative for chills, fever, malaise/fatigue and weight loss.  HENT: Negative for congestion, hearing loss, nosebleeds, sinus pain, sore throat and tinnitus.   Eyes: Negative for blurred vision and double vision.  Respiratory: Negative for cough, hemoptysis, sputum production, shortness of breath and wheezing.   Cardiovascular: Negative for chest pain, palpitations, orthopnea, claudication and leg swelling.  Gastrointestinal: Negative for abdominal pain, blood in stool, constipation, diarrhea, heartburn, melena, nausea and vomiting.  Genitourinary: Negative.   Musculoskeletal: Positive for joint pain (R thumb, L knee). Negative for back pain, myalgias and neck pain.  Skin: Negative for rash.  Neurological: Negative for dizziness, tingling, sensory change, weakness and headaches.  Endo/Heme/Allergies: Positive for environmental allergies. Negative for polydipsia.  Psychiatric/Behavioral: Negative for depression, memory loss, substance abuse and suicidal ideas. The patient is not nervous/anxious and does not have insomnia.   All other systems reviewed and are negative.   Physical Exam: Estimated body mass index is 27.38 kg/m as calculated from the following:    Height as of this encounter: 4\' 10"  (1.473 m).   Weight as of this encounter: 131 lb (59.4 kg). BP 102/60   Pulse 90   Temp 97.7 F (36.5 C)   Ht 4\' 10"  (1.473 m)   Wt 131 lb (59.4 kg)   SpO2 98%   BMI 27.38 kg/m  General Appearance: Well nourished, in no apparent distress.  Eyes: PERRLA, EOMs, conjunctiva no swelling or erythema ENT/Mouth: Ext aud canals clear, normal light reflex with TMs without erythema, bulging. Good dentition. No erythema, swelling, or exudate on post pharynx. Tonsils not swollen or erythematous. Hearing normal.  Neck: Supple, thyroid normal. No bruits  Respiratory: Respiratory effort normal, BS with scant coarse bronchial sounds of R base without rales, rhonchi, wheezing or stridor.  Cardio: RRR without murmurs, rubs or gallops. Brisk peripheral pulses without edema.  Chest: symmetric, with normal excursions and percussion.  Breasts: Defer to GYN Abdomen: Soft, nontender, no guarding, rebound, hernias, masses, or organomegaly.  Lymphatics: Non tender without lymphadenopathy.  Genitourinary: Defer to GYN Musculoskeletal: Full ROM all peripheral extremities, bilateral knees with crepitus, no effusion, heat, laxity, 5/5 strength throughout, and normal gait.  Skin: Warm, dry without rashes,lesions, Ecchymosis.  Neuro: Cranial nerves intact, reflexes equal bilaterally. Normal muscle tone, no cerebellar symptoms. Sensation intact.  Psych: Awake and oriented X 3, normal affect, Insight and Judgment appropriate.   EKG: WNL reviewed 04/2018, no concerns, defer  Izora Ribas 6:02 PM [facility]

## 2019-05-05 ENCOUNTER — Ambulatory Visit (INDEPENDENT_AMBULATORY_CARE_PROVIDER_SITE_OTHER): Payer: BC Managed Care – PPO | Admitting: Adult Health

## 2019-05-05 ENCOUNTER — Other Ambulatory Visit: Payer: Self-pay

## 2019-05-05 ENCOUNTER — Encounter: Payer: Self-pay | Admitting: Adult Health

## 2019-05-05 VITALS — BP 102/60 | HR 90 | Temp 97.7°F | Ht <= 58 in | Wt 131.0 lb

## 2019-05-05 DIAGNOSIS — Z130 Encounter for screening for diseases of the blood and blood-forming organs and certain disorders involving the immune mechanism: Secondary | ICD-10-CM | POA: Diagnosis not present

## 2019-05-05 DIAGNOSIS — Z1329 Encounter for screening for other suspected endocrine disorder: Secondary | ICD-10-CM

## 2019-05-05 DIAGNOSIS — E785 Hyperlipidemia, unspecified: Secondary | ICD-10-CM

## 2019-05-05 DIAGNOSIS — Z13220 Encounter for screening for lipoid disorders: Secondary | ICD-10-CM

## 2019-05-05 DIAGNOSIS — G8929 Other chronic pain: Secondary | ICD-10-CM

## 2019-05-05 DIAGNOSIS — Z79899 Other long term (current) drug therapy: Secondary | ICD-10-CM | POA: Diagnosis not present

## 2019-05-05 DIAGNOSIS — Z1231 Encounter for screening mammogram for malignant neoplasm of breast: Secondary | ICD-10-CM

## 2019-05-05 DIAGNOSIS — Z131 Encounter for screening for diabetes mellitus: Secondary | ICD-10-CM

## 2019-05-05 DIAGNOSIS — D509 Iron deficiency anemia, unspecified: Secondary | ICD-10-CM

## 2019-05-05 DIAGNOSIS — Z0000 Encounter for general adult medical examination without abnormal findings: Secondary | ICD-10-CM | POA: Diagnosis not present

## 2019-05-05 DIAGNOSIS — E663 Overweight: Secondary | ICD-10-CM

## 2019-05-05 DIAGNOSIS — Z87898 Personal history of other specified conditions: Secondary | ICD-10-CM

## 2019-05-05 DIAGNOSIS — B360 Pityriasis versicolor: Secondary | ICD-10-CM

## 2019-05-05 DIAGNOSIS — Z1389 Encounter for screening for other disorder: Secondary | ICD-10-CM | POA: Diagnosis not present

## 2019-05-05 DIAGNOSIS — E559 Vitamin D deficiency, unspecified: Secondary | ICD-10-CM

## 2019-05-05 DIAGNOSIS — N924 Excessive bleeding in the premenopausal period: Secondary | ICD-10-CM

## 2019-05-05 NOTE — Patient Instructions (Addendum)
Sherri Anthony , Thank you for taking time to come for your Annual Wellness Visit. I appreciate your ongoing commitment to your health goals. Please review the following plan we discussed and let me know if I can assist you in the future.   These are the goals we discussed: Goals    . HEMOGLOBIN A1C < 5.7    . LDL CALC < 100    . Weight (lb) < 125 lb (56.7 kg)       This is a list of the screening recommended for you and due dates:  Health Maintenance  Topic Date Due  . HIV Screening  01/20/1992  . Pap Smear  05/20/2021  . Tetanus Vaccine  04/22/2028  . Flu Shot  Completed     Know what a healthy weight is for you (BMI <25) and aim to maintain this  Aim for 7+ servings of fruits and vegetables daily  65-80+ fluid ounces of water or unsweet tea for healthy kidneys  Limit to max 1 drink of alcohol per day; avoid smoking/tobacco  Limit animal fats in diet for cholesterol and heart health - choose grass fed whenever available  Avoid highly processed foods, and foods high in saturated/trans fats  Aim for low stress - take time to unwind and care for your mental health  Aim for 150 min of moderate intensity exercise weekly for heart health, and weights twice weekly for bone health  Aim for 7-9 hours of sleep daily       Mammogram A mammogram is an X-ray of the breasts that is done to check for changes that are not normal. This test can screen for and find any changes that may suggest breast cancer. Mammograms are regularly done on women. A man may have a mammogram if he has a lump or swelling in his breast. This test can also help to find other changes and variations in the breast. Tell a doctor:  About any allergies you have.  If you have breast implants.  If you have had previous breast disease, biopsy, or surgery.  If you are breastfeeding.  If you are younger than age 67.  If you have a family history of breast cancer.  Whether you are pregnant or may be  pregnant. What are the risks? Generally, this is a safe procedure. However, problems may occur, including:  Exposure to radiation. Radiation levels are very low with this test.  The results being misinterpreted.  The need for further tests.  The inability of the mammogram to detect certain cancers. What happens before the procedure?  Have this test done about 1-2 weeks after your period. This is usually when your breasts are the least tender.  If you are visiting a new doctor or clinic, send any past mammogram images to your new doctor's office.  Wash your breasts and under your arms the day of the test.  Do not use deodorants, perfumes, lotions, or powders on the day of the test.  Take off any jewelry from your neck.  Wear clothes that you can change into and out of easily. What happens during the procedure?   You will undress from the waist up. You will put on a gown.  You will stand in front of the X-ray machine.  Each breast will be placed between two plastic or glass plates. The plates will press down on your breast for a few seconds. Try to stay as relaxed as possible. This does not cause any harm to your  breasts. Any discomfort you feel will be very brief.  X-rays will be taken from different angles of each breast. The procedure may vary among doctors and hospitals. What happens after the procedure?  The mammogram will be read by a specialist (radiologist).  You may need to do certain parts of the test again. This depends on the quality of the images.  Ask when your test results will be ready. Make sure you get your test results.  You may go back to your normal activities. Summary  A mammogram is a low energy X-ray of the breasts that is done to check for abnormal changes. A man may have this test if he has a lump or swelling in his breast.  Before the procedure, tell your doctor about any breast problems that you have had in the past.  Have this test done  about 1-2 weeks after your period.  For the test, each breast will be placed between two plastic or glass plates. The plates will press down on your breast for a few seconds.  The mammogram will be read by a specialist (radiologist). Ask when your test results will be ready. Make sure you get your test results. This information is not intended to replace advice given to you by your health care provider. Make sure you discuss any questions you have with your health care provider. Document Released: 08/15/2008 Document Revised: 01/07/2018 Document Reviewed: 01/07/2018 Elsevier Patient Education  2020 [employer].

## 2019-05-06 LAB — CBC WITH DIFFERENTIAL/PLATELET
Absolute Monocytes: 302 cells/uL (ref 200–950)
Basophils Absolute: 62 cells/uL (ref 0–200)
Basophils Relative: 1.2 %
Eosinophils Absolute: 83 cells/uL (ref 15–500)
Eosinophils Relative: 1.6 %
HCT: 40.1 % (ref 35.0–45.0)
Hemoglobin: 13.1 g/dL (ref 11.7–15.5)
Lymphs Abs: 1674 cells/uL (ref 850–3900)
MCH: 28.5 pg (ref 27.0–33.0)
MCHC: 32.7 g/dL (ref 32.0–36.0)
MCV: 87.4 fL (ref 80.0–100.0)
MPV: 10.4 fL (ref 7.5–12.5)
Monocytes Relative: 5.8 %
Neutro Abs: 3078 cells/uL (ref 1500–7800)
Neutrophils Relative %: 59.2 %
Platelets: 264 10*3/uL (ref 140–400)
RBC: 4.59 10*6/uL (ref 3.80–5.10)
RDW: 11.8 % (ref 11.0–15.0)
Total Lymphocyte: 32.2 %
WBC: 5.2 10*3/uL (ref 3.8–10.8)

## 2019-05-06 LAB — COMPLETE METABOLIC PANEL WITH GFR
AG Ratio: 1.4 (calc) (ref 1.0–2.5)
ALT: 9 U/L (ref 6–29)
AST: 12 U/L (ref 10–30)
Albumin: 4.1 g/dL (ref 3.6–5.1)
Alkaline phosphatase (APISO): 48 U/L (ref 31–125)
BUN: 13 mg/dL (ref 7–25)
CO2: 29 mmol/L (ref 20–32)
Calcium: 9.7 mg/dL (ref 8.6–10.2)
Chloride: 104 mmol/L (ref 98–110)
Creat: 0.72 mg/dL (ref 0.50–1.10)
GFR, Est African American: 120 mL/min/{1.73_m2} (ref >=60)
GFR, Est Non African American: 103 mL/min/{1.73_m2} (ref >=60)
Globulin: 3 g/dL (calc) (ref 1.9–3.7)
Glucose, Bld: 84 mg/dL (ref 65–99)
Potassium: 4.8 mmol/L (ref 3.5–5.3)
Sodium: 140 mmol/L (ref 135–146)
Total Bilirubin: 0.3 mg/dL (ref 0.2–1.2)
Total Protein: 7.1 g/dL (ref 6.1–8.1)

## 2019-05-06 LAB — IRON,TIBC AND FERRITIN PANEL
%SAT: 12 % (calc) — ABNORMAL LOW (ref 16–45)
Ferritin: 7 ng/mL — ABNORMAL LOW (ref 16–232)
Iron: 45 ug/dL (ref 40–190)
TIBC: 373 mcg/dL (calc) (ref 250–450)

## 2019-05-06 LAB — LIPID PANEL
Cholesterol: 205 mg/dL — ABNORMAL HIGH (ref <200)
HDL: 60 mg/dL (ref >=50)
LDL Cholesterol (Calc): 110 mg/dL (calc) — ABNORMAL HIGH
Non-HDL Cholesterol (Calc): 145 mg/dL (calc) — ABNORMAL HIGH (ref <130)
Total CHOL/HDL Ratio: 3.4 (calc) (ref <5.0)
Triglycerides: 229 mg/dL — ABNORMAL HIGH (ref <150)

## 2019-05-06 LAB — URINALYSIS, ROUTINE W REFLEX MICROSCOPIC
Bilirubin Urine: NEGATIVE
Glucose, UA: NEGATIVE
Hgb urine dipstick: NEGATIVE
Ketones, ur: NEGATIVE
Leukocytes,Ua: NEGATIVE
Nitrite: NEGATIVE
Protein, ur: NEGATIVE
Specific Gravity, Urine: 1.024 (ref 1.001–1.03)
pH: 7 (ref 5.0–8.0)

## 2019-05-06 LAB — HEMOGLOBIN A1C
Hgb A1c MFr Bld: 5.5 % of total Hgb (ref <5.7)
Mean Plasma Glucose: 111 (calc)
eAG (mmol/L): 6.2 (calc)

## 2019-05-06 LAB — VITAMIN D 25 HYDROXY (VIT D DEFICIENCY, FRACTURES): Vit D, 25-Hydroxy: 33 ng/mL (ref 30–100)

## 2019-05-06 LAB — TSH: TSH: 1.36 mIU/L

## 2019-05-06 LAB — MAGNESIUM: Magnesium: 1.9 mg/dL (ref 1.5–2.5)

## 2019-07-14 ENCOUNTER — Ambulatory Visit: Payer: BLUE CROSS/BLUE SHIELD

## 2019-08-25 ENCOUNTER — Other Ambulatory Visit: Payer: Self-pay

## 2019-08-25 ENCOUNTER — Other Ambulatory Visit: Payer: Self-pay | Admitting: Adult Health

## 2019-08-25 ENCOUNTER — Ambulatory Visit
Admission: RE | Admit: 2019-08-25 | Discharge: 2019-08-25 | Disposition: A | Discharge status: Home / Self Care | Payer: BC Managed Care – PPO | Source: Ambulatory Visit | Attending: Adult Health | Admitting: Adult Health

## 2019-08-25 DIAGNOSIS — Z1231 Encounter for screening mammogram for malignant neoplasm of breast: Secondary | ICD-10-CM

## 2019-08-25 DIAGNOSIS — M25549 Pain in joints of unspecified hand: Secondary | ICD-10-CM

## 2020-05-07 ENCOUNTER — Encounter: Payer: BLUE CROSS/BLUE SHIELD | Admitting: Adult Health

## 2020-05-30 DIAGNOSIS — G43009 Migraine without aura, not intractable, without status migrainosus: Secondary | ICD-10-CM | POA: Insufficient documentation

## 2020-05-30 NOTE — Progress Notes (Signed)
Complete Physical  Assessment and Plan:  Diagnoses and all orders for this visit:  Encounter for routine adult health examination with abnormal findings  Menorrhagia with regular cycle Follows with Sherri Anthony; declined sonotomogram due to insurance/cost  Overweight (BMI 25.0-29.9) Long discussion about weight loss, diet, and exercise Recommended diet heavy in fruits and veggies and low in animal meats, cheeses, and dairy products, appropriate calorie intake Discussed appropriate weight for height  Follow up at next visit  Cholesterol Initiate medication if LDL persists 130+ Continue low cholesterol diet and exercise.  - lipid panel  - TSH   Seronegative RA (HCC)/bil thumb pain Follows with Sherri Anthony, rheumatology Improved with methotrexate   Iron deficiency anemia On supplement without recent check  - CBC, iron/TIBC/ferritin  Other abnormal glucose (hx of prediabetes) Discussed disease and risks Discussed diet/exercise, weight management  -     Hemoglobin A1c  Screening for hematuria or proteinuria -     Urinalysis w microscopic + reflex cultur  Screening for thyroid disorder -     TSH  Vitamin D deficiency -     VITAMIN D 25 Hydroxy (Vit-D Deficiency, Fractures)  Medication management -     CBC with Differential/Platelet -     COMPLETE METABOLIC PANEL WITH GFR -     Magnesium  -     UA  Screening breast cancer Schedule March 2022 - phone number given  Cat C - 3D mammogram recommended  Episodic Chest pain/Epigastric pain/dyspnea/ L flank/back pain Unclear chest vs upper abdominal etiology, some language barrier Concern with reports of ? Dyspnea accompanying - low risk history, EKG negative today, denies with exertion, no other cardiac accompaniments ? Gastritis or ulcer r/t steroid/NSAID, r/o h. Pylori, hemoccult to r/o GI bleeding Check upper abdominal labs - lipase for pancreas - no alcohol, low risk history Check CXR due to pains and persistent  dry cough -  If workup negative, plan to try PPI/carafate Follow up in 4 weeks or sooner  Consider GI vs cardiology referral if persistent and not improving  Go to the ER if any chest pain, shortness of breath, nausea, dizziness, massive blood in stool -     CBC with Differential/Platelet -     COMPLETE METABOLIC PANEL WITH GFR  -     POC Hemoccult Bld/Stl (3-Cd Home Screen); Future -     H. pylori breath test -     Lipase -     DG Chest 2 View; Future -     EKG 12-Lead  Persistent dry cough -     DG Chest 2 View; Future  Screening for cardiovascular condition -     EKG 12-Lead - NSR, No ST changes  Encounter for screening mammogram for malignant neoplasm of breast -     MM Digital Screening; Future  Rash/ tinea versicolor -     ketoconazole (NIZORAL) 2 % cream; Apply cream to rash once daily for 2-4 weeks.  Orders Placed This Encounter  Procedures  . DG Chest 2 View  . CBC with Differential/Platelet  . COMPLETE METABOLIC PANEL WITH GFR  . Magnesium  . Lipid panel  . TSH  . Hemoglobin A1c  . VITAMIN D 25 Hydroxy (Vit-D Deficiency, Fractures)  . Urinalysis, Routine w reflex microscopic  . Iron, TIBC and Ferritin Panel  . H. pylori breath test  . Lipase  . POC Hemoccult Bld/Stl (3-Cd Home Screen)  . EKG 12-Lead     Discussed med's effects and SE's. Screening labs  and tests as requested with regular follow-up as recommended. Over 40 minutes of exam, counseling, chart review, and complex, high level critical decision making was performed this visit.   Future Appointments  Date Time Provider Department Center  06/04/2021 11:00 AM Sherri Gaudier, NP [facility] None    HPI  44 y.o. female presents for a complete physical. She has Menorrhagia; Overweight (BMI 25.0-29.9); Chronic thumb pain, bilateral; History of prediabetes; Hyperlipidemia; Vitamin D deficiency; Migraine without aura and without status migrainosus, not intractable; and Seronegative rheumatoid arthritis  (HCC) on their problem list.   She immigrated from [place], now is a citizen, married with 2 children. 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 No grandkids yet. 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  She is following annually with GYN Dr. Audie Box for PAP neg HPV 05/2018, some menorrhagia. Had initial mmg 08/2019 at breast center showing Cat C.   She reports in the last month has had intermittent epigastric/chest pain and radiation through the back/flank on the L; Sudden onset, tight/gnawing, 10/10, then waxing/waining for 30-1 hour. She reports drinking a yogurt drink tends to help. Denies N, V, D, C, reflux, denies blood in stool, black stools, boating, belching, gassiness. She however reports sense of tightness and shortness of breath when this happens. Denies dizziness, palpitations, fatigue, edema. Denies with exercise. Will wake her up when sleeping occasionally. Reports 2-4 episodes per day without identifiable trigger.   She reports took steroid last month for her hand, notes sx began about 1 week after, was taking ibuprofen/meloxicam prior to this. Denies alcohol intake.   She is newly following with Sherri Anthony at [facility] and diagnosed with seronegative RA, on methotrexate with perceived improvement.   She reports seasonal allergies, typically during fall and winter for which she takes OTC medication PRN.   Hx of migraines, L sided, without aura but with photosensitivity, this is rare, only had 2-3 last year, have improved recently and typically well controlled by PRN tylenol.   She has had rash (white spots on hands/forearms, ankles) suspect for tinea versicolor; she reports did try selsun blue topically that was suggested, doesn't feel this helped/   BMI is Body mass index is 29.05 kg/m., she has been working on diet Admits to less exercise, Just moved house and busy  unpacking boxes.  No weight goal, just wants to lose weight.  No alcohol intake.  Breakfast: croissant sandwich Lunch: spring rolls, fried rice  Wt Readings from Last 3 Encounters:  05/31/20 139 lb (63 kg)  05/05/19 131 lb (59.4 kg)  10/21/18 133 lb 3.2 oz (60.4 kg)   Today their BP is BP: 98/60 She does not workout.    She is not on cholesterol medication and denies myalgias. Her cholesterol is not at goal. The cholesterol last visit was:   Lab Results  Component Value Date   CHOL 205 (H) 05/05/2019   HDL 60 05/05/2019   LDLCALC 110 (H) 05/05/2019   TRIG 229 (H) 05/05/2019   CHOLHDL 3.4 05/05/2019    She has been working on diet and exercise for glucose management (hx of prediabetes, A1C 5.8% in 2019). Last A1C in the office was:  Lab Results  Component Value Date   HGBA1C 5.5 05/05/2019   Patient is on Vitamin D supplement.   Lab Results  Component Value Date   VD25OH 33 05/05/2019     Hx of menorrhagia,  intermittent iron def, was recommended iron supplement last visit, report has been off for several months Lab Results  Component Value Date   IRON 45 05/05/2019   TIBC 373 05/05/2019   FERRITIN 7 (L) 05/05/2019   Taking folic acid per rheumatology recommendation when methotrexate was started.   Current Medications:  Current Outpatient Medications on File Prior to Visit  Medication Sig Dispense Refill  . BIOTIN PO Take by mouth. Takes 2 gummies daily.    . folic acid (FOLVITE) 1 MG tablet Take 1 mg by mouth daily.    . methotrexate (RHEUMATREX) 2.5 MG tablet Take 2.5 mg by mouth once a week. Not currently taking    . Multiple Vitamins-Minerals (CENTRUM PO) Take by mouth. Takes 2 daily    . multivitamin-lutein (OCUVITE-LUTEIN) CAPS capsule Take 1 capsule by mouth daily.    Marland Kitchen Ubiquinol 100 MG CAPS Take 1 capsule by mouth daily.     No current facility-administered medications on file prior to visit.   Allergies:  No Known Allergies Medical History:  She has  Menorrhagia; Overweight (BMI 25.0-29.9); Chronic thumb pain, bilateral; History of prediabetes; Hyperlipidemia; Vitamin D deficiency; Migraine without aura and without status migrainosus, not intractable; and Seronegative rheumatoid arthritis (HCC) on their problem list. Health Maintenance:   Immunization History  Administered Date(s) Administered  . Influenza-Unspecified 03/01/2020  . PPD Test 04/22/2018  . Td 04/22/2018    Tetanus: 2019 Pneumovax: -  Flu vaccine: 02/2020 Covid 19: 3/3, 2021, moderna -   LMP: No LMP recorded. Pap: 05/2018 - neg/no HPV MGM: 08/25/2019 neg, cat C   Colonoscopy: never, due age 50 EGD:  Last Dental Exam: Last 2020 - encouraged to schedule Last Eye Exam: Last 2019, glasses   Patient Care Team: Lucky Cowboy, MD as PCP - General (Internal Medicine)  Surgical History:  She has a past surgical history that includes Cesarean section and Tubal ligation (Bilateral). Family History:  Herfamily history includes Anemia in her mother; Diabetes in her father, paternal grandfather, and paternal grandmother; Heart disease in her father; Kidney Stones in her father; Migraines in her mother; Obesity in her father. Social History:  She reports that she has never smoked. She has never used smokeless tobacco. She reports that she does not drink alcohol and does not use drugs.  Review of Systems: Review of Systems  Constitutional: Negative for chills, fever, malaise/fatigue and weight loss.  HENT: Negative for congestion, hearing loss, nosebleeds, sinus pain, sore throat and tinnitus.   Eyes: Negative for blurred vision and double vision.  Respiratory: Positive for cough (persistsent dry cough, only at work). Negative for hemoptysis, sputum production, shortness of breath and wheezing.   Cardiovascular: Positive for chest pain (episodic, ? epigastric, with dyspnea). Negative for palpitations, orthopnea, claudication and leg swelling.  Gastrointestinal: Positive  for abdominal pain (? vs chest). Negative for blood in stool, constipation, diarrhea, heartburn, melena, nausea and vomiting.  Genitourinary: Negative.   Musculoskeletal: Positive for joint pain (R thumb, L knee). Negative for back pain, myalgias and neck pain.  Skin: Negative for rash.  Neurological: Negative for dizziness, tingling, sensory change, weakness and headaches.  Endo/Heme/Allergies: Negative for environmental allergies and polydipsia.  Psychiatric/Behavioral: Negative for depression, memory loss, substance abuse and suicidal ideas. The patient is not nervous/anxious and does not have insomnia.   All other systems reviewed and are negative.   Physical Exam: Estimated body mass index is 29.05 kg/m as calculated from the following:   Height as of this encounter: 4\' 10"  (  1.473 m).   Weight as of this encounter: 139 lb (63 kg). BP 98/60   Pulse 89   Temp (!) 97 F (36.1 C)   Ht 4\' 10"  (1.473 m)   Wt 139 lb (63 kg)   SpO2 98%   BMI 29.05 kg/m  General Appearance: Well nourished, in no apparent distress.  Eyes: PERRLA, EOMs, conjunctiva no swelling or erythema ENT/Mouth: Ext aud canals clear, normal light reflex with TMs without erythema, bulging. Good dentition. No erythema, swelling, or exudate on post pharynx. Tonsils not swollen or erythematous. Hearing normal.  Neck: Supple, thyroid normal. No bruits  Respiratory: Respiratory effort normal, BS present/clear throughout without rales, rhonchi, wheezing or stridor.  Cardio: RRR without murmurs, rubs or gallops. Brisk peripheral pulses without edema.  Chest: symmetric, with normal excursions and percussion.  Breasts: Defer to GYN Abdomen: Soft, nontender (indicates epigastric area as location of episodic pain), no guarding, rebound, hernias, masses, or organomegaly.  Lymphatics: Non tender without lymphadenopathy.  Genitourinary: Defer to GYN Musculoskeletal: Full ROM all peripheral extremities, bilateral knees with  crepitus, no effusion, heat, laxity, 5/5 strength throughout, and normal gait.  Skin: Warm, dry without lesions, Ecchymosis. She has white patchy/spots lightening of skin without raised lesions or discharge to bil hands/forearms, ankles Neuro: Cranial nerves intact, reflexes equal bilaterally. Normal muscle tone, no cerebellar symptoms. Sensation intact.  Psych: Awake and oriented X 3, normal affect, Insight and Judgment appropriate.   EKG: NSR, no ST changes  Sherri Anthony 12:40 PM [facility]

## 2020-05-31 ENCOUNTER — Other Ambulatory Visit: Payer: Self-pay

## 2020-05-31 ENCOUNTER — Encounter: Payer: Self-pay | Admitting: Adult Health

## 2020-05-31 ENCOUNTER — Ambulatory Visit
Admission: RE | Admit: 2020-05-31 | Discharge: 2020-05-31 | Disposition: A | Discharge status: Home / Self Care | Payer: BC Managed Care – PPO | Source: Ambulatory Visit | Attending: Adult Health | Admitting: Adult Health

## 2020-05-31 ENCOUNTER — Ambulatory Visit (INDEPENDENT_AMBULATORY_CARE_PROVIDER_SITE_OTHER): Payer: BC Managed Care – PPO | Admitting: Adult Health

## 2020-05-31 VITALS — BP 98/60 | HR 89 | Temp 97.0°F | Ht <= 58 in | Wt 139.0 lb

## 2020-05-31 DIAGNOSIS — I10 Essential (primary) hypertension: Secondary | ICD-10-CM

## 2020-05-31 DIAGNOSIS — E559 Vitamin D deficiency, unspecified: Secondary | ICD-10-CM

## 2020-05-31 DIAGNOSIS — E611 Iron deficiency: Secondary | ICD-10-CM

## 2020-05-31 DIAGNOSIS — R053 Chronic cough: Secondary | ICD-10-CM

## 2020-05-31 DIAGNOSIS — Z0000 Encounter for general adult medical examination without abnormal findings: Secondary | ICD-10-CM | POA: Diagnosis not present

## 2020-05-31 DIAGNOSIS — Z79899 Other long term (current) drug therapy: Secondary | ICD-10-CM | POA: Diagnosis not present

## 2020-05-31 DIAGNOSIS — Z1389 Encounter for screening for other disorder: Secondary | ICD-10-CM | POA: Diagnosis not present

## 2020-05-31 DIAGNOSIS — Z136 Encounter for screening for cardiovascular disorders: Secondary | ICD-10-CM | POA: Diagnosis not present

## 2020-05-31 DIAGNOSIS — M79645 Pain in left finger(s): Secondary | ICD-10-CM

## 2020-05-31 DIAGNOSIS — Z1231 Encounter for screening mammogram for malignant neoplasm of breast: Secondary | ICD-10-CM

## 2020-05-31 DIAGNOSIS — Z130 Encounter for screening for diseases of the blood and blood-forming organs and certain disorders involving the immune mechanism: Secondary | ICD-10-CM

## 2020-05-31 DIAGNOSIS — M0600 Rheumatoid arthritis without rheumatoid factor, unspecified site: Secondary | ICD-10-CM

## 2020-05-31 DIAGNOSIS — B360 Pityriasis versicolor: Secondary | ICD-10-CM

## 2020-05-31 DIAGNOSIS — Z131 Encounter for screening for diabetes mellitus: Secondary | ICD-10-CM

## 2020-05-31 DIAGNOSIS — Z13220 Encounter for screening for lipoid disorders: Secondary | ICD-10-CM | POA: Diagnosis not present

## 2020-05-31 DIAGNOSIS — R1013 Epigastric pain: Secondary | ICD-10-CM

## 2020-05-31 DIAGNOSIS — R079 Chest pain, unspecified: Secondary | ICD-10-CM

## 2020-05-31 DIAGNOSIS — E785 Hyperlipidemia, unspecified: Secondary | ICD-10-CM

## 2020-05-31 DIAGNOSIS — G43009 Migraine without aura, not intractable, without status migrainosus: Secondary | ICD-10-CM

## 2020-05-31 DIAGNOSIS — Z1329 Encounter for screening for other suspected endocrine disorder: Secondary | ICD-10-CM | POA: Diagnosis not present

## 2020-05-31 DIAGNOSIS — N924 Excessive bleeding in the premenopausal period: Secondary | ICD-10-CM

## 2020-05-31 DIAGNOSIS — R21 Rash and other nonspecific skin eruption: Secondary | ICD-10-CM

## 2020-05-31 DIAGNOSIS — E663 Overweight: Secondary | ICD-10-CM

## 2020-05-31 DIAGNOSIS — Z87898 Personal history of other specified conditions: Secondary | ICD-10-CM

## 2020-05-31 MED ORDER — KETOCONAZOLE 2 % EX CREA: TOPICAL_CREAM | CUTANEOUS | 0 refills | Status: DC

## 2020-05-31 NOTE — Patient Instructions (Addendum)
Sherri Anthony , Thank you for taking time to come for your Annual Wellness Visit. I appreciate your ongoing commitment to your health goals. Please review the following plan we discussed and let me know if I can assist you in the future.   These are the goals we discussed: Goals    . HEMOGLOBIN A1C < 5.7    . LDL CALC < 100    . Weight (lb) < 125 lb (56.7 kg)       This is a list of the screening recommended for you and due dates:  Health Maintenance  Topic Date Due  .  Hepatitis C: One time screening is recommended by Center for Disease Control  (CDC) for  adults born from [birth year] through 1964.   Never done  . HIV Screening  Never done  . Pap Smear  05/20/2021  . Tetanus Vaccine  04/22/2028  . Flu Shot  Completed    For persistent dry cough:  Try allergy medication (fexofenadine, loratadine, certirizine) - try any, take daily for 2 weeks and see if cough improved   If not improving, can take cough medicatoin (robitussin, delsym) prior to work to suppress cough.    To help weight loss:  Stop croissants, fried rice, etc Eat more whole grains, beans, fish, vegetables and fruit  Try old fashioned oat meal for breakfast with fruit and nuts      Please ask [facility] for record of your covid vaccine (moderna)  HOW TO SCHEDULE A MAMMOGRAM - DUE after 08/24/2020 - Please call to schedule on a date later than this   You have dense breasts - if insurance covers 3D mammogram, this would be best   [facility]  7 a.m.-6:30 p.m., Monday 7 a.m.-5 p.m., Tuesday-Friday Schedule an appointment by calling [phone].       Nonspecific Chest Pain, Adult Chest pain can be caused by many different conditions. It can be caused by a condition that is life-threatening and requires treatment right away. It can also be caused by something that is not life-threatening. If you have chest pain, it can be hard to know the difference, so it is important to get help right  away to make sure that you do not have a serious condition. Some life-threatening causes of chest pain include:  Heart attack.  A tear in the body's main blood vessel (aortic dissection).  Inflammation around your heart (pericarditis).  A problem in the lungs, such as a blood clot (pulmonary embolism) or a collapsed lung (pneumothorax). Some non life-threatening causes of chest pain include:  Heartburn.  Anxiety or stress.  Damage to the bones, muscles, and cartilage that make up your chest wall.  Pneumonia or bronchitis.  Shingles infection (varicella-zoster virus). Chest pain can feel like:  Pain or discomfort on the surface of your chest or deep in your chest.  Crushing, pressure, aching, or squeezing pain.  Burning or tingling.  Dull or sharp pain that is worse when you move, cough, or take a deep breath.  Pain or discomfort that is also felt in your back, neck, jaw, shoulder, or arm, or pain that spreads to any of these areas. Your chest pain may come and go. It may also be constant. Your health care provider will do lab tests and other studies to find the cause of your pain. Treatment will depend on the cause of your chest pain. Follow these instructions at home: Medicines  Take over-the-counter and prescription medicines only  as told by your health care provider.  If you were prescribed an antibiotic, take it as told by your health care provider. Do not stop taking the antibiotic even if you start to feel better. Lifestyle   Rest as directed by your health care provider.  Do not use any products that contain nicotine or tobacco, such as cigarettes and e-cigarettes. If you need help quitting, ask your health care provider.  Do not drink alcohol.  Make healthy lifestyle choices as recommended. These may include: ? Getting regular exercise. Ask your health care provider to suggest some activities that are safe for you. ? Eating a heart-healthy diet. This includes  plenty of fresh fruits and vegetables, whole grains, low-fat (lean) protein, and low-fat dairy products. A dietitian can help you find healthy eating options. ? Maintaining a healthy weight. ? Managing any other health conditions you have, such as high blood pressure (hypertension) or diabetes. ? Reducing stress, such as with yoga or relaxation techniques. General instructions  Pay attention to any changes in your symptoms. Tell your health care provider about them or any new symptoms.  Avoid any activities that cause chest pain.  Keep all follow-up visits as told by your health care provider. This is important. This includes visits for any further testing if your chest pain does not go away. Contact a health care provider if:  Your chest pain does not go away.  You feel depressed.  You have a fever. Get help right away if:  Your chest pain gets worse.  You have a cough that gets worse, or you cough up blood.  You have severe pain in your abdomen.  You faint.  You have sudden, unexplained chest discomfort.  You have sudden, unexplained discomfort in your arms, back, neck, or jaw.  You have shortness of breath at any time.  You suddenly start to sweat, or your skin gets clammy.  You feel nausea or you vomit.  You suddenly feel lightheaded or dizzy.  You have severe weakness, or unexplained weakness or fatigue.  Your heart begins to beat quickly, or it feels like it is skipping beats. These symptoms may represent a serious problem that is an emergency. Do not wait to see if the symptoms will go away. Get medical help right away. Call your local emergency services (911 in the [place] Do not drive yourself to the hospital. Summary  Chest pain can be caused by a condition that is serious and requires urgent treatment. It may also be caused by something that is not life-threatening.  If you have chest pain, it is very important to see your health care provider. Your health  care provider may do lab tests and other studies to find the cause of your pain.  Follow your health care provider's instructions on taking medicines, making lifestyle changes, and getting emergency treatment if symptoms become worse.  Keep all follow-up visits as told by your health care provider. This includes visits for any further testing if your chest pain does not go away. This information is not intended to replace advice given to you by your health care provider. Make sure you discuss any questions you have with your health care provider. Document Revised: 11/19/2017 Document Reviewed: 11/19/2017 Elsevier Patient Education  2020 [employer].

## 2020-06-01 ENCOUNTER — Other Ambulatory Visit: Payer: Self-pay | Admitting: Adult Health

## 2020-06-01 DIAGNOSIS — A048 Other specified bacterial intestinal infections: Secondary | ICD-10-CM

## 2020-06-01 DIAGNOSIS — R1013 Epigastric pain: Secondary | ICD-10-CM | POA: Insufficient documentation

## 2020-06-01 HISTORY — DX: Other specified bacterial intestinal infections: A04.8

## 2020-06-01 LAB — COMPLETE METABOLIC PANEL WITH GFR
AG Ratio: 1.5 (calc) (ref 1.0–2.5)
ALT: 15 U/L (ref 6–29)
AST: 15 U/L (ref 10–30)
Albumin: 4.2 g/dL (ref 3.6–5.1)
Alkaline phosphatase (APISO): 56 U/L (ref 31–125)
BUN: 12 mg/dL (ref 7–25)
CO2: 29 mmol/L (ref 20–32)
Calcium: 9.2 mg/dL (ref 8.6–10.2)
Chloride: 104 mmol/L (ref 98–110)
Creat: 0.57 mg/dL (ref 0.50–1.10)
GFR, Est African American: 132 mL/min/{1.73_m2} (ref >=60)
GFR, Est Non African American: 114 mL/min/{1.73_m2} (ref >=60)
Globulin: 2.8 g/dL (calc) (ref 1.9–3.7)
Glucose, Bld: 86 mg/dL (ref 65–99)
Potassium: 4.3 mmol/L (ref 3.5–5.3)
Sodium: 139 mmol/L (ref 135–146)
Total Bilirubin: 0.4 mg/dL (ref 0.2–1.2)
Total Protein: 7 g/dL (ref 6.1–8.1)

## 2020-06-01 LAB — CBC WITH DIFFERENTIAL/PLATELET
Absolute Monocytes: 646 cells/uL (ref 200–950)
Basophils Absolute: 92 cells/uL (ref 0–200)
Basophils Relative: 1.3 %
Eosinophils Absolute: 78 cells/uL (ref 15–500)
Eosinophils Relative: 1.1 %
HCT: 32.8 % — ABNORMAL LOW (ref 35.0–45.0)
Hemoglobin: 10.1 g/dL — ABNORMAL LOW (ref 11.7–15.5)
Lymphs Abs: 2322 cells/uL (ref 850–3900)
MCH: 23.8 pg — ABNORMAL LOW (ref 27.0–33.0)
MCHC: 30.8 g/dL — ABNORMAL LOW (ref 32.0–36.0)
MCV: 77.2 fL — ABNORMAL LOW (ref 80.0–100.0)
MPV: 10.5 fL (ref 7.5–12.5)
Monocytes Relative: 9.1 %
Neutro Abs: 3962 cells/uL (ref 1500–7800)
Neutrophils Relative %: 55.8 %
Platelets: 354 10*3/uL (ref 140–400)
RBC: 4.25 10*6/uL (ref 3.80–5.10)
RDW: 15.7 % — ABNORMAL HIGH (ref 11.0–15.0)
Total Lymphocyte: 32.7 %
WBC: 7.1 10*3/uL (ref 3.8–10.8)

## 2020-06-01 LAB — IRON,TIBC AND FERRITIN PANEL
%SAT: 6 % (calc) — ABNORMAL LOW (ref 16–45)
Ferritin: 3 ng/mL — ABNORMAL LOW (ref 16–232)
Iron: 28 ug/dL — ABNORMAL LOW (ref 40–190)
TIBC: 468 mcg/dL (calc) — ABNORMAL HIGH (ref 250–450)

## 2020-06-01 LAB — H. PYLORI BREATH TEST: H. pylori Breath Test: DETECTED — AB

## 2020-06-01 LAB — LIPASE: Lipase: 29 U/L (ref 7–60)

## 2020-06-01 LAB — URINALYSIS, ROUTINE W REFLEX MICROSCOPIC
Bilirubin Urine: NEGATIVE
Glucose, UA: NEGATIVE
Hgb urine dipstick: NEGATIVE
Ketones, ur: NEGATIVE
Leukocytes,Ua: NEGATIVE
Nitrite: NEGATIVE
Protein, ur: NEGATIVE
Specific Gravity, Urine: 1.022 (ref 1.001–1.03)
pH: 6.5 (ref 5.0–8.0)

## 2020-06-01 LAB — HEMOGLOBIN A1C
Hgb A1c MFr Bld: 5.8 % of total Hgb — ABNORMAL HIGH (ref <5.7)
Mean Plasma Glucose: 120 mg/dL
eAG (mmol/L): 6.6 mmol/L

## 2020-06-01 LAB — VITAMIN D 25 HYDROXY (VIT D DEFICIENCY, FRACTURES): Vit D, 25-Hydroxy: 23 ng/mL — ABNORMAL LOW (ref 30–100)

## 2020-06-01 LAB — LIPID PANEL
Cholesterol: 204 mg/dL — ABNORMAL HIGH (ref <200)
HDL: 72 mg/dL (ref >=50)
LDL Cholesterol (Calc): 112 mg/dL (calc) — ABNORMAL HIGH
Non-HDL Cholesterol (Calc): 132 mg/dL (calc) — ABNORMAL HIGH (ref <130)
Total CHOL/HDL Ratio: 2.8 (calc) (ref <5.0)
Triglycerides: 98 mg/dL (ref <150)

## 2020-06-01 LAB — TSH: TSH: 2.24 mIU/L

## 2020-06-01 LAB — MAGNESIUM: Magnesium: 2.1 mg/dL (ref 1.5–2.5)

## 2020-06-01 MED ORDER — PYLERA 140-125-125 MG PO CAPS: 3.0000 | ORAL_CAPSULE | Freq: Three times a day (TID) | ORAL | 0 refills | Status: DC

## 2020-06-01 MED ORDER — OMEPRAZOLE 20 MG PO CPDR
20.0000 mg | DELAYED_RELEASE_CAPSULE | Freq: Two times a day (BID) | ORAL | 0 refills | Status: DC
Start: 2020-06-01 — End: 2020-08-16

## 2020-06-02 ENCOUNTER — Other Ambulatory Visit: Payer: Self-pay | Admitting: Internal Medicine

## 2020-06-06 ENCOUNTER — Other Ambulatory Visit: Payer: Self-pay | Admitting: Adult Health

## 2020-06-06 DIAGNOSIS — A048 Other specified bacterial intestinal infections: Secondary | ICD-10-CM

## 2020-06-06 MED ORDER — PEPTO-BISMOL 524 MG/30ML PO SUSP: 30.0000 mL | Freq: Four times a day (QID) | ORAL | 0 refills | Status: DC

## 2020-06-06 MED ORDER — METRONIDAZOLE 250 MG PO TABS
250.0000 mg | ORAL_TABLET | Freq: Four times a day (QID) | ORAL | 0 refills | Status: DC
Start: 2020-06-06 — End: 2020-08-16

## 2020-06-06 MED ORDER — TETRACYCLINE HCL 500 MG PO CAPS: 500.0000 mg | ORAL_CAPSULE | Freq: Four times a day (QID) | ORAL | 0 refills | Status: DC

## 2020-06-14 ENCOUNTER — Ambulatory Visit: Payer: BC Managed Care – PPO | Admitting: Adult Health

## 2020-06-19 ENCOUNTER — Ambulatory Visit
Admission: EM | Admit: 2020-06-19 | Discharge: 2020-06-19 | Disposition: A | Discharge status: Home / Self Care | Payer: BC Managed Care – PPO | Attending: Emergency Medicine | Admitting: Emergency Medicine

## 2020-06-19 ENCOUNTER — Other Ambulatory Visit: Payer: Self-pay

## 2020-06-19 DIAGNOSIS — J069 Acute upper respiratory infection, unspecified: Secondary | ICD-10-CM | POA: Diagnosis not present

## 2020-06-19 DIAGNOSIS — Z20822 Contact with and (suspected) exposure to covid-19: Secondary | ICD-10-CM | POA: Diagnosis not present

## 2020-06-19 MED ORDER — BENZONATATE 200 MG PO CAPS: 200.0000 mg | ORAL_CAPSULE | Freq: Three times a day (TID) | ORAL | 0 refills | Status: AC | PRN

## 2020-06-19 MED ORDER — DM-GUAIFENESIN ER 30-600 MG PO TB12: 1.0000 | ORAL_TABLET | Freq: Two times a day (BID) | ORAL | 0 refills | Status: DC

## 2020-06-19 NOTE — ED Provider Notes (Signed)
[facility]    CSN: [id number] Arrival date & time: 06/19/20  1129      History   Chief Complaint Chief Complaint  Patient presents with  . Cough    HPI Sherri Anthony is a 45 y.o. female presenting today for evaluation of URI symptoms.  Reports cough, sore throat, headache and diarrhea.  Symptoms began 2 to 3 days ago.  Has been here with similar symptoms.  Denies any debility breathing or shortness of breath.  Has had some fevers and hot cold chills.  HPI  Past Medical History:  Diagnosis Date  . History of prediabetes 04/23/2018  . Migraines   . Seasonal allergies     Patient Active Problem List   Diagnosis Date Noted  . H. pylori infection 06/01/2020  . Epigastric pain 06/01/2020  . Seronegative rheumatoid arthritis (HCC) 05/31/2020  . Migraine without aura and without status migrainosus, not intractable 05/30/2020  . History of prediabetes 04/23/2018  . Hyperlipidemia 04/23/2018  . Vitamin D deficiency 04/23/2018  . Menorrhagia 04/22/2018  . Tinea versicolor 04/22/2018  . Overweight (BMI 25.0-29.9) 04/22/2018  . Chronic thumb pain, bilateral 04/22/2018    Past Surgical History:  Procedure Laterality Date  . CESAREAN SECTION     x 2, 2002, 1999  . TUBAL LIGATION Bilateral    2002    OB History    Gravida  2   Para  2   Term      Preterm      AB      Living  2     SAB      IAB      Ectopic      Multiple      Live Births               Home Medications    Prior to Admission medications   Medication Sig Start Date End Date Taking? Authorizing Provider  benzonatate (TESSALON) 200 MG capsule Take 1 capsule (200 mg total) by mouth 3 (three) times daily as needed for up to 7 days for cough. 06/19/20 06/26/20 Yes Cyrilla Durkin C, PA-C  dextromethorphan-guaiFENesin (MUCINEX DM) 30-600 MG 12hr tablet Take 1 tablet by mouth 2 (two) times daily. 06/19/20  Yes Janeliz Prestwood C, PA-C  BIOTIN PO Take by mouth. Takes 2 gummies  daily.    [provider]  bismuth subsalicylate (PEPTO-BISMOL) 262 MG/15ML suspension Take 30 mLs by mouth 4 (four) times daily for 14 days. 06/06/20 06/20/20  Judd Gaudier, NP  folic acid (FOLVITE) 1 MG tablet Take 1 mg by mouth daily.    [provider]  ketoconazole (NIZORAL) 2 % cream Apply cream to rash once daily for 2-4 weeks. 05/31/20   Judd Gaudier, NP  methotrexate (RHEUMATREX) 2.5 MG tablet Take 2.5 mg by mouth once a week. Not currently taking    [provider]  metroNIDAZOLE (FLAGYL) 250 MG tablet Take 1 tablet (250 mg total) by mouth 4 (four) times daily for 14 days. 06/06/20 06/20/20  Judd Gaudier, NP  Multiple Vitamins-Minerals (CENTRUM PO) Take by mouth. Takes 2 daily    [provider]  multivitamin-lutein (OCUVITE-LUTEIN) CAPS capsule Take 1 capsule by mouth daily.    [provider]  omeprazole (PRILOSEC) 20 MG capsule Take 1 capsule (20 mg total) by mouth 2 (two) times daily before a meal for 14 days. 06/01/20 06/15/20  Judd Gaudier, NP  tetracycline (SUMYCIN) 500 MG capsule Take 1 capsule (500 mg total) by mouth 4 (  four) times daily for 14 days. 06/06/20 06/20/20  Judd Gaudier, NP  Ubiquinol 100 MG CAPS Take 1 capsule by mouth daily.    [provider]    Family History Family History  Problem Relation Age of Onset  . Migraines Mother   . Anemia Mother   . Obesity Father   . Heart disease Father   . Diabetes Father   . Kidney Stones Father   . Diabetes Paternal Grandmother   . Diabetes Paternal Grandfather     Social History Social History   Tobacco Use  . Smoking status: Never Smoker  . Smokeless tobacco: Never Used  Vaping Use  . Vaping Use: Never used  Substance Use Topics  . Alcohol use: Never  . Drug use: Never     Allergies   Patient has no known allergies.   Review of Systems Review of Systems  Constitutional: Negative for activity change, appetite change, chills, fatigue and fever.   HENT: Positive for congestion, rhinorrhea and sore throat. Negative for ear pain, sinus pressure and trouble swallowing.   Eyes: Negative for discharge and redness.  Respiratory: Positive for cough. Negative for chest tightness and shortness of breath.   Cardiovascular: Negative for chest pain.  Gastrointestinal: Positive for diarrhea. Negative for abdominal pain, nausea and vomiting.  Musculoskeletal: Negative for myalgias.  Skin: Negative for rash.  Neurological: Negative for dizziness, light-headedness and headaches.     Physical Exam Triage Vital Signs ED Triage Vitals  Enc Vitals Group     BP      Pulse      Resp      Temp      Temp src      SpO2      Weight      Height      Head Circumference      Peak Flow      Pain Score      Pain Loc      Pain Edu?      Excl. in GC?    No data found.  Updated Vital Signs BP 109/66 (BP Location: Left Arm)   Pulse 76   Temp 98.5 F (36.9 C) (Oral)   Resp 18   LMP 06/04/2020   SpO2 98%   Visual Acuity Right Eye Distance:   Left Eye Distance:   Bilateral Distance:    Right Eye Near:   Left Eye Near:    Bilateral Near:     Physical Exam Vitals and nursing note reviewed.  Constitutional:      Appearance: She is well-developed and well-nourished.     Comments: No acute distress  HENT:     Head: Normocephalic and atraumatic.     Ears:     Comments: Bilateral ears without tenderness to palpation of external auricle, tragus and mastoid, EAC's without erythema or swelling, TM's with good bony landmarks and cone of light. Non erythematous.     Nose: Nose normal.     Mouth/Throat:     Comments: Oral mucosa pink and moist, no tonsillar enlargement or exudate. Posterior pharynx patent and nonerythematous, no uvula deviation or swelling. Normal phonation. Eyes:     Conjunctiva/sclera: Conjunctivae normal.  Cardiovascular:     Rate and Rhythm: Normal rate.  Pulmonary:     Effort: Pulmonary effort is normal. No respiratory  distress.     Comments: Breathing comfortably at rest, CTABL, no wheezing, rales or other adventitious sounds auscultated Abdominal:     General: There is no  distension.  Musculoskeletal:        General: Normal range of motion.     Cervical back: Neck supple.  Skin:    General: Skin is warm and dry.  Neurological:     Mental Status: She is alert and oriented to person, place, and time.  Psychiatric:        Mood and Affect: Mood and affect normal.      UC Treatments / Results  Labs (all labs ordered are listed, but only abnormal results are displayed) Labs Reviewed  NOVEL CORONAVIRUS, NAA    EKG   Radiology No results found.  Procedures Procedures (including critical care time)  Medications Ordered in UC Medications - No data to display  Initial Impression / Assessment and Plan / UC Course  I have reviewed the triage vital signs and the nursing notes.  Pertinent labs & imaging results that were available during my care of the patient were reviewed by me and considered in my medical decision making (see chart for details).     Viral URI with cough-COVID test pending, suspect likely viral etiology and recommend symptomatic and supportive care, exam reassuring.  Discussed strict return precautions. Patient verbalized understanding and is agreeable with plan.  Final Clinical Impressions(s) / UC Diagnoses   Final diagnoses:  Encounter for screening laboratory testing for COVID-19 virus  Viral URI with cough     Discharge Instructions     COVID test pending, monitor MyChart for results Tessalon every 8 hours for cough Mucinex DM twice daily for further cough and congestion relief Ibuprofen and Tylenol for headaches, body aches Rest and fluids Follow-up if not improving or worsening    ED Prescriptions    Medication Sig Dispense Auth. Provider   benzonatate (TESSALON) 200 MG capsule Take 1 capsule (200 mg total) by mouth 3 (three) times daily as needed for  up to 7 days for cough. 28 capsule Tylar Amborn C, PA-C   dextromethorphan-guaiFENesin (MUCINEX DM) 30-600 MG 12hr tablet Take 1 tablet by mouth 2 (two) times daily. 20 tablet Reinhart Saulters, [place] C, PA-C     PDMP not reviewed this encounter.   Lew Dawes, PA-C 06/19/20 1314

## 2020-06-19 NOTE — Discharge Instructions (Signed)
COVID test pending, monitor MyChart for results Tessalon every 8 hours for cough Mucinex DM twice daily for further cough and congestion relief Ibuprofen and Tylenol for headaches, body aches Rest and fluids Follow-up if not improving or worsening

## 2020-06-19 NOTE — ED Triage Notes (Signed)
Pt c/o cough, sore throat, headache, fatigue, and diarrhea since 1/16. Pt states needs a note.

## 2020-06-21 ENCOUNTER — Encounter: Payer: Self-pay | Admitting: Physician Assistant

## 2020-06-21 DIAGNOSIS — Z6828 Body mass index (BMI) 28.0-28.9, adult: Secondary | ICD-10-CM | POA: Insufficient documentation

## 2020-06-21 LAB — NOVEL CORONAVIRUS, NAA: SARS-CoV-2, NAA: NOT DETECTED

## 2020-06-21 LAB — SARS-COV-2, NAA 2 DAY TAT

## 2020-06-25 ENCOUNTER — Telehealth (HOSPITAL_COMMUNITY): Payer: Self-pay

## 2020-06-25 ENCOUNTER — Other Ambulatory Visit: Payer: Self-pay | Admitting: Physician Assistant

## 2020-06-25 MED ORDER — MOLNUPIRAVIR EUA 200MG CAPSULE: 4.0000 | ORAL_CAPSULE | Freq: Two times a day (BID) | ORAL | 0 refills | Status: AC

## 2020-06-25 MED FILL — MOLNUPIRAVIR 200 MG CAPS: 200 | 5 days supply | Qty: 40 | Fill #0

## 2020-06-25 NOTE — Progress Notes (Addendum)
Outpatient Oral COVID Treatment Note  I connected with Sherri Anthony on 06/25/2020/11:02 AM by telephone and verified that I am speaking with the correct person using two identifiers.  I discussed the limitations, risks, security, and privacy concerns of performing an evaluation and management service by telephone and the availability of in person appointments. I also discussed with the patient that there may be a patient responsible charge related to this service. The patient expressed understanding and agreed to proceed.  Patient location: home  Provider location: office   Diagnosis: COVID-19 infection  Purpose of visit: Discussion of potential use of Molnupiravir or Paxlovid, a new treatment for mild to moderate COVID-19 viral infection in non-hospitalized patients.   Subjective: Patient is a 45 y.o. female who has been diagnosed with COVID 19 viral infection.  Their symptoms began on 1/20 with cough, body aches and chills..    Past Medical History:  Diagnosis Date  . BMI 28.0-28.9,adult   . History of prediabetes 04/23/2018  . Migraines   . Seasonal allergies     No Known Allergies   Current Outpatient Medications:  .  molnupiravir EUA 200 mg CAPS, Take 4 capsules (800 mg total) by mouth 2 (two) times daily for 5 days., Disp: 40 capsule, Rfl: 0 .  benzonatate (TESSALON) 200 MG capsule, Take 1 capsule (200 mg total) by mouth 3 (three) times daily as needed for up to 7 days for cough., Disp: 28 capsule, Rfl: 0 .  BIOTIN PO, Take by mouth. Takes 2 gummies daily., Disp: , Rfl:  .  dextromethorphan-guaiFENesin (MUCINEX DM) 30-600 MG 12hr tablet, Take 1 tablet by mouth 2 (two) times daily., Disp: 20 tablet, Rfl: 0 .  folic acid (FOLVITE) 1 MG tablet, Take 1 mg by mouth daily., Disp: , Rfl:  .  ketoconazole (NIZORAL) 2 % cream, Apply cream to rash once daily for 2-4 weeks., Disp: 60 g, Rfl: 0 .  methotrexate (RHEUMATREX) 2.5 MG tablet, Take 2.5 mg by mouth once a week. Not currently  taking, Disp: , Rfl:  .  Multiple Vitamins-Minerals (CENTRUM PO), Take by mouth. Takes 2 daily, Disp: , Rfl:  .  multivitamin-lutein (OCUVITE-LUTEIN) CAPS capsule, Take 1 capsule by mouth daily., Disp: , Rfl:  .  omeprazole (PRILOSEC) 20 MG capsule, Take 1 capsule (20 mg total) by mouth 2 (two) times daily before a meal for 14 days., Disp: 28 capsule, Rfl: 0 .  Ubiquinol 100 MG CAPS, Take 1 capsule by mouth daily., Disp: , Rfl:   Objective: Patient sounds stable.  They are in no apparent distress.  Breathing is non labored.  Mood and behavior are normal.  Laboratory Data:  Recent Results (from the past 2160 hour(s))  CBC with Differential/Platelet     Status: Abnormal   Collection Time: 05/31/20 12:24 PM  Result Value Ref Range   WBC 7.1 3.8 - 10.8 Thousand/uL   RBC 4.25 3.80 - 5.10 Million/uL   Hemoglobin 10.1 (L) 11.7 - 15.5 g/dL   HCT 16.132.8 (L) 09.635.0 - 04.545.0 %   MCV 77.2 (L) 80.0 - 100.0 fL   MCH 23.8 (L) 27.0 - 33.0 pg   MCHC 30.8 (L) 32.0 - 36.0 g/dL   RDW 40.915.7 (H) 81.111.0 - 91.415.0 %   Platelets 354 140 - 400 Thousand/uL   MPV 10.5 7.5 - 12.5 fL   Neutro Abs 3,962 1,500 - 7,800 cells/uL   Lymphs Abs 2,322 850 - 3,900 cells/uL   Absolute Monocytes 646 200 - 950 cells/uL   Eosinophils  Absolute 78 15 - 500 cells/uL   Basophils Absolute 92 0 - 200 cells/uL   Neutrophils Relative % 55.8 %   Total Lymphocyte 32.7 %   Monocytes Relative 9.1 %   Eosinophils Relative 1.1 %   Basophils Relative 1.3 %  COMPLETE METABOLIC PANEL WITH GFR     Status: None   Collection Time: 05/31/20 12:24 PM  Result Value Ref Range   Glucose, Bld 86 65 - 99 mg/dL    Comment: .            Fasting reference interval .    BUN 12 7 - 25 mg/dL   Creat 9.44 9.67 - 5.91 mg/dL   GFR, Est Non African American 114 > OR = 60 mL/min/1.59m2   GFR, Est African American 132 > OR = 60 mL/min/1.74m2   BUN/Creatinine Ratio NOT APPLICABLE 6 - 22 (calc)   Sodium 139 135 - 146 mmol/L   Potassium 4.3 3.5 - 5.3 mmol/L    Chloride 104 98 - 110 mmol/L   CO2 29 20 - 32 mmol/L   Calcium 9.2 8.6 - 10.2 mg/dL   Total Protein 7.0 6.1 - 8.1 g/dL   Albumin 4.2 3.6 - 5.1 g/dL   Globulin 2.8 1.9 - 3.7 g/dL (calc)   AG Ratio 1.5 1.0 - 2.5 (calc)   Total Bilirubin 0.4 0.2 - 1.2 mg/dL   Alkaline phosphatase (APISO) 56 31 - 125 U/L   AST 15 10 - 30 U/L   ALT 15 6 - 29 U/L  Magnesium     Status: None   Collection Time: 05/31/20 12:24 PM  Result Value Ref Range   Magnesium 2.1 1.5 - 2.5 mg/dL  Lipid panel     Status: Abnormal   Collection Time: 05/31/20 12:24 PM  Result Value Ref Range   Cholesterol 204 (H) <200 mg/dL   HDL 72 > OR = 50 mg/dL   Triglycerides 98 <638 mg/dL   LDL Cholesterol (Calc) 112 (H) mg/dL (calc)    Comment: Reference range: <100 . Desirable range <100 mg/dL for primary prevention;   <70 mg/dL for patients with CHD or diabetic patients  with > or = 2 CHD risk factors. Marland Kitchen LDL-C is now calculated using the Martin-Hopkins  calculation, which is a validated novel method providing  better accuracy than the Friedewald equation in the  estimation of LDL-C.  Horald Pollen et al. Lenox Ahr. 4665;993(57): 2061-2068  (http://education.[facility].com/faq/FAQ164)    Total CHOL/HDL Ratio 2.8 <5.0 (calc)   Non-HDL Cholesterol (Calc) 132 (H) <130 mg/dL (calc)    Comment: For patients with diabetes plus 1 major ASCVD risk  factor, treating to a non-HDL-C goal of <100 mg/dL  (LDL-C of <01 mg/dL) is considered a therapeutic  option.   TSH     Status: None   Collection Time: 05/31/20 12:24 PM  Result Value Ref Range   TSH 2.24 mIU/L    Comment:           Reference Range .           > or = 20 Years  0.40-4.50 .                Pregnancy Ranges           First trimester    0.26-2.66           Second trimester   0.55-2.73           Third trimester    0.43-2.91   Hemoglobin A1c  Status: Abnormal   Collection Time: 05/31/20 12:24 PM  Result Value Ref Range   Hgb A1c MFr Bld 5.8 (H) <5.7 % of total  Hgb    Comment: For someone without known diabetes, a hemoglobin  A1c value between 5.7% and 6.4% is consistent with prediabetes and should be confirmed with a  follow-up test. . For someone with known diabetes, a value <7% indicates that their diabetes is well controlled. A1c targets should be individualized based on duration of diabetes, age, comorbid conditions, and other considerations. . This assay result is consistent with an increased risk of diabetes. . Currently, no consensus exists regarding use of hemoglobin A1c for diagnosis of diabetes for children. .    Mean Plasma Glucose 120 mg/dL   eAG (mmol/L) 6.6 mmol/L  VITAMIN D 25 Hydroxy (Vit-D Deficiency, Fractures)     Status: Abnormal   Collection Time: 05/31/20 12:24 PM  Result Value Ref Range   Vit D, 25-Hydroxy 23 (L) 30 - 100 ng/mL    Comment: Vitamin D Status         25-OH Vitamin D: . Deficiency:                    <20 ng/mL Insufficiency:             20 - 29 ng/mL Optimal:                 > or = 30 ng/mL . For 25-OH Vitamin D testing on patients on  D2-supplementation and patients for whom quantitation  of D2 and D3 fractions is required, the [facility]ureD(TM) 25-OH VIT D, (D2,D3), LC/MS/MS is recommended: order  code 63016 (patients >82yrs). See Note 1 . Note 1 . For additional information, please refer to  http://education.[facility].com/faq/FAQ199  (This link is being provided for informational/ educational purposes only.)   Urinalysis, Routine w reflex microscopic     Status: None   Collection Time: 05/31/20 12:24 PM  Result Value Ref Range   Color, Urine YELLOW YELLOW   APPearance CLEAR CLEAR   Specific Gravity, Urine 1.022 1.001 - 1.03   pH 6.5 5.0 - 8.0   Glucose, UA NEGATIVE NEGATIVE   Bilirubin Urine NEGATIVE NEGATIVE   Ketones, ur NEGATIVE NEGATIVE   Hgb urine dipstick NEGATIVE NEGATIVE   Protein, ur NEGATIVE NEGATIVE   Nitrite NEGATIVE NEGATIVE   Leukocytes,Ua NEGATIVE NEGATIVE   Iron, TIBC and Ferritin Panel     Status: Abnormal   Collection Time: 05/31/20 12:24 PM  Result Value Ref Range   Iron 28 (L) 40 - 190 mcg/dL   TIBC 010 (H) 932 - 355 mcg/dL (calc)   %SAT 6 (L) 16 - 45 % (calc)   Ferritin 3 (L) 16 - 232 ng/mL  H. pylori breath test     Status: Abnormal   Collection Time: 05/31/20 12:24 PM  Result Value Ref Range   H. pylori Breath Test DETECTED (A) NOT DETECT    Comment: . Antimicrobials, proton pump inhibitors, and bismuth preparations are known to suppress H. pylori, and  ingestion of these prior to H. pylori diagnostic testing may lead to false negative results. If clinically  indicated, the test may be repeated on a new specimen obtained two weeks after discontinuing treatment. However, a positive result is still clinically valid.   Lipase     Status: None   Collection Time: 05/31/20 12:24 PM  Result Value Ref Range   Lipase 29 7 - 60 U/L  Novel Coronavirus, NAA (  [facility])     Status: None   Collection Time: 06/19/20 12:43 PM   Specimen: Nasopharyngeal Swab; Nasopharyngeal(NP) swabs in vial transport medium   Nasopharynge  Result Value Ref Range   SARS-CoV-2, NAA Not Detected Not Detected    Comment: This nucleic acid amplification test was developed and its performance characteristics determined by [employer]. Nucleic acid amplification tests include RT-PCR and TMA. This test has not been FDA cleared or approved. This test has been authorized by FDA under an Emergency Use Authorization (EUA). This test is only authorized for the duration of time the declaration that circumstances exist justifying the authorization of the emergency use of in vitro diagnostic tests for detection of SARS-CoV-2 virus and/or diagnosis of COVID-19 infection under section 564(b)(1) of the Act, 21 U.S.C. 196QIW-9(N) (1), unless the authorization is terminated or revoked sooner. When diagnostic testing is negative, the possibility of a false negative  result should be considered in the context of a patient's recent exposures and the presence of clinical signs and symptoms consistent with COVID-19. An individual without symptoms of COVID-19 and who is not shedding SARS-CoV-2 virus wo uld expect to have a negative (not detected) result in this assay.   SARS-COV-2, NAA 2 DAY TAT     Status: None   Collection Time: 06/19/20 12:43 PM   Nasopharynge  Result Value Ref Range   SARS-CoV-2, NAA 2 DAY TAT Performed      Assessment: 45 y.o. female with mild/moderate COVID 19 viral infection diagnosed on 1/20at high risk for progression to severe COVID 19.  Plan:  This patient is a 45 y.o. female that meets the following criteria for Emergency Use Authorization of: Molnupiravir  1. Age >18 yr 2. SARS-COV-2 positive test 3. Symptom onset < 5 days 4. Mild-to-moderate COVID disease with high risk for severe progression to hospitalization or death   I have spoken and communicated the following to the patient or parent/caregiver regarding: 1. Molnupiravir is an unapproved drug that is authorized for use under an TEFL teacher.  2. There are no adequate, approved, available products for the treatment of COVID-19 in adults who have mild-to-moderate COVID-19 and are at high risk for progressing to severe COVID-19, including hospitalization or death. 3. Other therapeutics are currently authorized. For additional information on all products authorized for treatment or prevention of COVID-19, please see https://www.graham-miller.com/.  4. There are benefits and risks of taking this treatment as outlined in the "Fact Sheet for Patients and Caregivers."  5. "Fact Sheet for Patients and Caregivers" was reviewed with patient. A hard copy will be provided to patient from pharmacy prior to the patient receiving treatment. 6. Patients should continue to  self-isolate and use infection control measures (e.g., wear mask, isolate, social distance, avoid sharing personal items, clean and disinfect "high touch" surfaces, and frequent handwashing) according to CDC guidelines.  7. The patient or parent/caregiver has the option to accept or refuse treatment. 8. [employer] has established a pregnancy surveillance program. 9. Females of childbearing potential should use a reliable method of contraception correctly and consistently, as applicable, for the duration of treatment and for 4 days after the last dose of Molnupiravir. 10. Males of reproductive potential who are sexually active with females of childbearing potential should use a reliable method of contraception correctly and consistently during treatment and for at least 3 months after the last dose. 11. Pregnancy status and risk was assessed. Patient verbalized understanding of precautions.   After reviewing above information  with the patient, the patient agrees to receive molnupiravir.  Follow up instructions:    . Take prescription BID x 5 days as directed . Reach out to pharmacist for counseling on medication if desired . For concerns regarding further COVID symptoms please follow up with your PCP or urgent care . For urgent or life-threatening issues, seek care at your local emergency department  The patient was provided an opportunity to ask questions, and all were answered. The patient agreed with the plan and demonstrated an understanding of the instructions.   [facility] and opted to pick up RX.  The patient was advised to call their PCP or seek an in-person evaluation if the symptoms worsen or if the condition fails to improve as anticipated.   I provided 10 minutes of non face-to-face telephone visit time during this encounter, and > 50% was spent counseling as documented under my assessment & plan.  Cline CrockKathryn Zoiey Christy, PA-C 06/25/2020  /11:02 AM

## 2020-06-25 NOTE — Telephone Encounter (Signed)
Patient was prescribed oral covid treatment Molnupiravir and treatment note was reviewed. Medication has been received by [facility] and reviewed for appropriateness.  Drug Interactions or Dosage Adjustments Noted: none  Delivery Method: pick up  Patient contacted for counseling on 06/25/20 and verbalized understanding.   Delivery or Pick-Up Date: 06/25/20   Kirstie Peri 06/25/2020, 11:30 AM [facility] Pharmacist Phone# [phone]

## 2020-06-26 ENCOUNTER — Other Ambulatory Visit: Payer: Self-pay

## 2020-06-26 ENCOUNTER — Encounter: Payer: Self-pay | Admitting: Adult Health

## 2020-06-26 ENCOUNTER — Ambulatory Visit: Payer: BC Managed Care – PPO | Admitting: Adult Health

## 2020-06-26 DIAGNOSIS — R1013 Epigastric pain: Secondary | ICD-10-CM

## 2020-06-26 DIAGNOSIS — E611 Iron deficiency: Secondary | ICD-10-CM

## 2020-06-26 DIAGNOSIS — Z0000 Encounter for general adult medical examination without abnormal findings: Secondary | ICD-10-CM

## 2020-06-26 DIAGNOSIS — U071 COVID-19: Secondary | ICD-10-CM | POA: Insufficient documentation

## 2020-06-26 LAB — POC HEMOCCULT BLD/STL (HOME/3-CARD/SCREEN)
Card #2 Fecal Occult Blod, POC: NEGATIVE
Card #3 Fecal Occult Blood, POC: NEGATIVE
Fecal Occult Blood, POC: NEGATIVE

## 2020-06-26 NOTE — Progress Notes (Signed)
THIS ENCOUNTER IS A VIRTUAL VISIT DUE TO COVID-19 - PATIENT WAS NOT SEEN IN THE OFFICE.  PATIENT HAS CONSENTED TO VIRTUAL VISIT / TELEMEDICINE VISIT   Virtual Visit via telephone Note  I connected with Sherri Anthony on 06/26/2020 by telephone.  I verified that I am speaking with the correct person using two identifiers.    I discussed the limitations of evaluation and management by telemedicine and the availability of in person appointments. The patient expressed understanding and agreed to proceed.  History of Present Illness:  LMP 06/04/2020   45 y.o. patient with hx of RA on methotrexate contacted office due to  covid 19. This patient is not vaccinated for covid 19. She is from [place] originally, husband assists with history when unclear.   Husband was diagnosed with covid 19 on 06/19/2020. She had negative test on that date.   Sx began 5 days ago on 06/21/2020 with "cold" like symptoms with sore throat, running nose, mildly productive cough (feels like from throat), denies chest congestion, was having some chest aching, also noted fatigued and weak with exertion, fatigued/malaise, also body and headache. She had + test at location out of the system on 06/23/2020. She contacted Elsie Lincoln, PA who had taken care of husband and was initiated on molnupiravir yesterday.  She reports significantly improved sx this AM, no longer having headache, dyspnea or chest aching. She denies fever/chills. Very mild cough.   Exposures: husband diagnosed 06/17/2020 with covid 19  Vaccination: none   Medications    Current Outpatient Medications (Respiratory):  .  benzonatate (TESSALON) 200 MG capsule, Take 1 capsule (200 mg total) by mouth 3 (three) times daily as needed for up to 7 days for cough. .  dextromethorphan-guaiFENesin (MUCINEX DM) 30-600 MG 12hr tablet, Take 1 tablet by mouth 2 (two) times daily.   Current Outpatient Medications (Hematological):  .  folic acid (FOLVITE) 1 MG  tablet, Take 1 mg by mouth daily.  Current Outpatient Medications (Other):  .  BIOTIN PO, Take by mouth. Takes 2 gummies daily. Marland Kitchen  ketoconazole (NIZORAL) 2 % cream, Apply cream to rash once daily for 2-4 weeks. .  methotrexate (RHEUMATREX) 2.5 MG tablet, Take 2.5 mg by mouth once a week. Not currently taking .  molnupiravir EUA 200 mg CAPS, Take 4 capsules (800 mg total) by mouth 2 (two) times daily for 5 days. .  Multiple Vitamins-Minerals (CENTRUM PO), Take by mouth. Takes 2 daily .  multivitamin-lutein (OCUVITE-LUTEIN) CAPS capsule, Take 1 capsule by mouth daily. Marland Kitchen  Ubiquinol 100 MG CAPS, Take 1 capsule by mouth daily. Marland Kitchen  omeprazole (PRILOSEC) 20 MG capsule, Take 1 capsule (20 mg total) by mouth 2 (two) times daily before a meal for 14 days.  Allergies: No Known Allergies  Problem list She has Menorrhagia; Tinea versicolor; Overweight (BMI 25.0-29.9); Chronic thumb pain, bilateral; History of prediabetes; Hyperlipidemia; Vitamin D deficiency; Migraine without aura and without status migrainosus, not intractable; Seronegative rheumatoid arthritis (HCC); H. pylori infection; Epigastric pain; BMI 28.0-28.9,adult; and COVID-19 on their problem list.   Social History:   reports that she has never smoked. She has never used smokeless tobacco. She reports that she does not drink alcohol and does not use drugs.   Observations/Objective:  General : Well sounding patient in no apparent distress HEENT: no hoarseness, no cough for duration of visit Lungs: speaks in complete sentences, no audible wheezing, no apparent distress Neurological: alert, oriented x 3 Psychiatric: pleasant, judgement appropriate   Assessment and Plan:  COVID-19  Covid 19 positive in unvaccinated  Currently symptoms are mild - improved with molnupiravir Risk factors include RA on methotrexate Regular breathing exercises, proning EC bASA daily for clot prevention unless contraindicated, regular walking/calf  exercises Take tylenol PRN temp 101+ Push hydration Sx supportive therapy Immune support with vitamin C, zinc, vitamin D reviewed Follow up via mychart or telephone if needed Advised patient obtain O2 monitor; present to ED if persistently <88% or with severe dyspnea, any CP, fever uncontrolled by tylenol, confusion, sudden decline Should remain in isolation until at least 10 days from onset of sx, 24-48 hours fever free without tylenol, sx such as cough are improved.    Follow Up Instructions:  I discussed the assessment and treatment plan with the patient. The patient was provided an opportunity to ask questions and all were answered. The patient agreed with the plan and demonstrated an understanding of the instructions.   The patient was advised to call back or seek an in-person evaluation if the symptoms worsen or if the condition fails to improve as anticipated.  I provided 20 minutes of non-face-to-face time during this encounter.   Dan Maker, NP   Future Appointments  Date Time Provider Department Center  06/21/2021  9:30 AM Judd Gaudier, NP [facility] None

## 2020-06-27 DIAGNOSIS — Z1211 Encounter for screening for malignant neoplasm of colon: Secondary | ICD-10-CM

## 2020-06-27 DIAGNOSIS — Z1212 Encounter for screening for malignant neoplasm of rectum: Secondary | ICD-10-CM | POA: Diagnosis not present

## 2020-07-02 ENCOUNTER — Other Ambulatory Visit: Payer: Self-pay | Admitting: Internal Medicine

## 2020-07-03 ENCOUNTER — Encounter: Payer: Self-pay | Admitting: Internal Medicine

## 2020-07-04 DIAGNOSIS — U071 COVID-19: Secondary | ICD-10-CM

## 2020-08-15 NOTE — Progress Notes (Signed)
Assessment and Plan:  Sherri Anthony was seen today for follow-up.  Diagnoses and all orders for this visit:  H. pylori infection Never completed treatment due to miscommunication Reviewed in detail today; resent meds and given detailed instructions to review with her husband;  Encouraged to reach out should she have further questions Will see her back in 6 weeks at least 2 weeks off of PPI -     bismuth subsalicylate (PEPTO-BISMOL) 262 MG/15ML suspension; Take 30 mLs by mouth 4 (four) times daily for 14 days. -     tetracycline (SUMYCIN) 500 MG capsule; Take 1 capsule (500 mg total) by mouth 4 (four) times daily for 14 days. -     metroNIDAZOLE (FLAGYL) 250 MG tablet; Take 1 tablet (250 mg total) by mouth 4 (four) times daily for 14 days. -     omeprazole (PRILOSEC) 20 MG capsule; Take 1 capsule (20 mg total) by mouth 2 (two) times daily before a meal for 14 days.   Further disposition pending results of labs. Discussed med's effects and SE's.   Over 30 minutes of exam, counseling, chart review, and critical decision making was performed.   Future Appointments  Date Time Provider Department Center  10/04/2020 11:00 AM Judd Gaudier, NP [facility] None  06/21/2021  9:30 AM Judd Gaudier, NP [facility] None    ------------------------------------------------------------------------------------------------------------------   HPI BP 108/72   Pulse 87   Temp (!) 97.5 F (36.4 C)   Wt 139 lb (63 kg)   SpO2 97%   BMI 29.05 kg/m   44 y.o.female presents for follow up on H. Pylori + epigastric pain.   She was reporting several months of epigastric pain episodes at her CPE 05/31/2020; H. Pylori breath test at that time returned +; she was prescribed 2 week tetracycline/metronidazole/bismuth therapy with omeprazole 20 mg BID; today she follows up stating feeling much better, only 1 episode of abdominal pain since last OV. However, it became clear there was miscommunication and has not  actually started/completed the prescribed treatment.   She did recover from recent covid 19 infection in Jan 2022; she reports some chest wall aching with excess activity, improves with tylenol and rest.     Past Medical History:  Diagnosis Date  . BMI 28.0-28.9,adult   . History of prediabetes 04/23/2018  . Migraines   . Seasonal allergies      No Known Allergies  Current Outpatient Medications on File Prior to Visit  Medication Sig  . Ferrous Sulfate (IRON SUPPLEMENT PO) Take 65 mg by mouth daily.  Marland Kitchen ketoconazole (NIZORAL) 2 % cream Apply cream to rash once daily for 2-4 weeks.  . methotrexate (RHEUMATREX) 2.5 MG tablet Take 2.5 mg by mouth once a week. Not currently taking  . VITAMIN D, CHOLECALCIFEROL, PO Take 5,000 Units by mouth daily.  Marland Kitchen BIOTIN PO Take by mouth. Takes 2 gummies daily. (Patient not taking: Reported on 08/16/2020)  . dextromethorphan-guaiFENesin (MUCINEX DM) 30-600 MG 12hr tablet Take 1 tablet by mouth 2 (two) times daily.  . folic acid (FOLVITE) 1 MG tablet Take 1 mg by mouth daily. (Patient not taking: Reported on 08/16/2020)  . Multiple Vitamins-Minerals (CENTRUM PO) Take by mouth. Takes 2 daily (Patient not taking: Reported on 08/16/2020)  . multivitamin-lutein (OCUVITE-LUTEIN) CAPS capsule Take 1 capsule by mouth daily. (Patient not taking: Reported on 08/16/2020)  . Ubiquinol 100 MG CAPS Take 1 capsule by mouth daily. (Patient not taking: Reported on 08/16/2020)   No current facility-administered medications on file prior to  visit.    ROS: all negative except above.   Physical Exam:  BP 108/72   Pulse 87   Temp (!) 97.5 F (36.4 C)   Wt 139 lb (63 kg)   SpO2 97%   BMI 29.05 kg/m   General Appearance: Well nourished, in no apparent distress. Eyes: PERRLA, conjunctiva no swelling or erythema ENT/Mouth: mask in place; Hearing normal.  Neck: Supple, thyroid normal.  Respiratory: Respiratory effort normal, BS equal bilaterally without rales, rhonchi,  wheezing or stridor.  Cardio: RRR with no MRGs. Brisk peripheral pulses without edema.  Abdomen: Soft, + BS.  Non tender, no guarding, rebound, hernias, masses. Musculoskeletal: normal gait.  Skin: Warm, dry without rashes, lesions, ecchymosis.  Neuro: Normal muscle tone Psych: Awake and oriented X 3, normal affect, Insight and Judgment appropriate.     Dan Maker, NP 11:37 AM [facility]

## 2020-08-16 ENCOUNTER — Encounter: Payer: Self-pay | Admitting: Adult Health

## 2020-08-16 ENCOUNTER — Ambulatory Visit (INDEPENDENT_AMBULATORY_CARE_PROVIDER_SITE_OTHER): Payer: BC Managed Care – PPO | Admitting: Adult Health

## 2020-08-16 ENCOUNTER — Other Ambulatory Visit: Payer: Self-pay

## 2020-08-16 VITALS — BP 108/72 | HR 87 | Temp 97.5°F | Wt 139.0 lb

## 2020-08-16 DIAGNOSIS — R1013 Epigastric pain: Secondary | ICD-10-CM

## 2020-08-16 DIAGNOSIS — A048 Other specified bacterial intestinal infections: Secondary | ICD-10-CM

## 2020-08-16 MED ORDER — OMEPRAZOLE 20 MG PO CPDR
20.0000 mg | DELAYED_RELEASE_CAPSULE | Freq: Two times a day (BID) | ORAL | 0 refills | Status: DC
Start: 2020-08-16 — End: 2021-06-22

## 2020-08-16 MED ORDER — TETRACYCLINE HCL 500 MG PO CAPS: 500.0000 mg | ORAL_CAPSULE | Freq: Four times a day (QID) | ORAL | 0 refills | Status: AC

## 2020-08-16 MED ORDER — METRONIDAZOLE 250 MG PO TABS: 250.0000 mg | ORAL_TABLET | Freq: Four times a day (QID) | ORAL | 0 refills | Status: AC

## 2020-08-16 MED ORDER — PEPTO-BISMOL 524 MG/30ML PO SUSP
30.0000 mL | Freq: Four times a day (QID) | ORAL | 0 refills | Status: AC
Start: 2020-08-16 — End: 2020-08-30

## 2020-10-03 ENCOUNTER — Encounter: Payer: Self-pay | Admitting: Adult Health

## 2020-10-03 DIAGNOSIS — D509 Iron deficiency anemia, unspecified: Secondary | ICD-10-CM | POA: Insufficient documentation

## 2020-10-03 NOTE — Progress Notes (Signed)
Assessment and Plan:  Sherri Anthony was seen today for follow-up.  Diagnoses and all orders for this visit:  H. pylori infection Completed treatment and PPI over 2 weeks ago, denies sx, no tenderness, here for recheck Reviewed in detail today;  given detailed instructions to review with her husband;  Encouraged probiotic, fermented foods, slowly increase fiber intake  Encouraged to reach out should she have further questions -     H. pylori breath test  Iron deficiency anemia, unspecified iron deficiency anemia type Continue iron supplement until resolved; hemoccult was negative  -     CBC with Differential/Platelet -     Iron, TIBC and Ferritin Panel  Costochondral chest pain + tenderness to palpations on chest, non exertional, with no accompaniments- due to recent GI advised topical NSAID and muscle rubs, rest/limiting straining, can try ice after work, muscle rubs  Further disposition pending results of labs. Discussed med's effects and SE's.   Over 30 minutes of exam, counseling, chart review, and critical decision making was performed.   Future Appointments  Date Time Provider Department Center  06/21/2021  9:30 AM Judd Gaudier, NP [facility] None    ------------------------------------------------------------------------------------------------------------------   HPI BP 104/64   Pulse 80   Temp (!) 97.3 F (36.3 C)   Ht 4\' 10"  (1.473 m)   Wt 141 lb (64 kg)   SpO2 97%   BMI 29.47 kg/m   45 y.o.female immigrant from presents for follow up on H. Pylori + epigastric pain.   She was reporting several months of epigastric pain episodes at her CPE 05/31/2020; H. Pylori breath test at that time returned +; she was prescribed 2 week tetracycline/metronidazole/bismuth therapy with omeprazole 20 mg BID; however she had covid 19 and didn't start treatment until March 2022. Today she follows up stating feeling much better, denies sx in the last month.   She was found  to have iron def anemia, did have negative hemoccult x 3 on 06/26/2020; she has been on iron supplement 65 mg slow release iron daily and doing well.  CBC Latest Ref Rng & Units 05/31/2020 05/05/2019 10/21/2018  WBC 3.8 - 10.8 Thousand/uL 7.1 5.2 6.8  Hemoglobin 11.7 - 15.5 g/dL 10.1(L) 13.1 12.9  Hematocrit 35.0 - 45.0 % 32.8(L) 40.1 40.3  Platelets 140 - 400 Thousand/uL 354 264 283   Lab Results  Component Value Date   IRON 28 (L) 05/31/2020   TIBC 468 (H) 05/31/2020   FERRITIN 3 (L) 05/31/2020     Today she repeorts some chest aching and tenderness after work 06/02/2020); denies chest pressure, palpitations, dyspnea, dizziness, fatigue. Tenderness with palpation. Hasn't tried anything.   Past Medical History:  Diagnosis Date  . BMI 28.0-28.9,adult   . H. pylori infection 06/01/2020  . History of prediabetes 04/23/2018  . Migraines   . Seasonal allergies      No Known Allergies  Current Outpatient Medications on File Prior to Visit  Medication Sig  . Ferrous Sulfate (IRON SUPPLEMENT PO) Take 65 mg by mouth daily.  . folic acid (FOLVITE) 1 MG tablet Take 1 mg by mouth daily.  . methotrexate (RHEUMATREX) 2.5 MG tablet Take 2.5 mg by mouth once a week. Not currently taking  . VITAMIN D, CHOLECALCIFEROL, PO Take 5,000 Units by mouth daily.  04/25/2018 ketoconazole (NIZORAL) 2 % cream Apply cream to rash once daily for 2-4 weeks. (Patient not taking: Reported on 10/04/2020)  . Molnupiravir 200 MG CAPS TAKE 4 CAPSULES BY MOUTH 2 TIMES DAILY (Patient  not taking: Reported on 10/04/2020)  . omeprazole (PRILOSEC) 20 MG capsule Take 1 capsule (20 mg total) by mouth 2 (two) times daily before a meal for 14 days.  Marland Kitchen Ubiquinol 100 MG CAPS Take 1 capsule by mouth daily. (Patient not taking: No sig reported)   No current facility-administered medications on file prior to visit.   Surgical History:  She  has a past surgical history that includes Cesarean section and Tubal ligation (Bilateral). Family  History:  Herfamily history includes Anemia in her mother; Diabetes in her father, paternal grandfather, and paternal grandmother; Heart disease in her father; Kidney Stones in her father; Migraines in her mother; Obesity in her father. Social History:   reports that she has never smoked. She has never used smokeless tobacco. She reports that she does not drink alcohol and does not use drugs.   ROS: all negative except above.   Physical Exam:  BP 104/64   Pulse 80   Temp (!) 97.3 F (36.3 C)   Ht 4\' 10"  (1.473 m)   Wt 141 lb (64 kg)   SpO2 97%   BMI 29.47 kg/m   General Appearance: Well nourished, in no apparent distress. Eyes: PERRLA, conjunctiva no swelling or erythema ENT/Mouth: mask in place; Hearing normal.  Neck: Supple, thyroid normal.  Respiratory: Respiratory effort normal, BS equal bilaterally without rales, rhonchi, wheezing or stridor.  Cardio: RRR with no MRGs. Brisk peripheral pulses without edema.  Abdomen: Soft, + BS.  Non tender, no guarding, rebound, hernias, masses. Musculoskeletal: normal gait. She does have anterior chest wall tenderness along costochondral borders.  Skin: Warm, dry without rashes, lesions, ecchymosis.  Neuro: Normal muscle tone Psych: Awake and oriented X 3, normal affect, Insight and Judgment appropriate.     , NP 11:30 AM [facility]

## 2020-10-04 ENCOUNTER — Encounter: Payer: Self-pay | Admitting: Adult Health

## 2020-10-04 ENCOUNTER — Other Ambulatory Visit: Payer: Self-pay

## 2020-10-04 ENCOUNTER — Ambulatory Visit: Payer: BC Managed Care – PPO | Admitting: Adult Health

## 2020-10-04 VITALS — BP 104/64 | HR 80 | Temp 97.3°F | Ht <= 58 in | Wt 141.0 lb

## 2020-10-04 DIAGNOSIS — R0789 Other chest pain: Secondary | ICD-10-CM

## 2020-10-04 DIAGNOSIS — R1013 Epigastric pain: Secondary | ICD-10-CM

## 2020-10-04 DIAGNOSIS — D509 Iron deficiency anemia, unspecified: Secondary | ICD-10-CM | POA: Diagnosis not present

## 2020-10-04 DIAGNOSIS — A048 Other specified bacterial intestinal infections: Secondary | ICD-10-CM

## 2020-10-04 NOTE — Patient Instructions (Addendum)
Increase fermented foods at home to help stomach recover from recent antibiotics - do not heat/cook fermented foods to keep benefits  Kimchi, natto, yogurt/kefir, tempe, sour kraut, etc   High fiber - lots of fresh fruits, vegetables, whole grain (brown rice, barley, quinoa, old fashioned oats, whole grain pasta/bread, etc), beans, tofu, seeds to help build good gut bacteria (microbiome)  When increasing high fiber foods - start with small portions and increase gradually to help build tolerance   For chest muscle aching - try icee hot or muscle rub after work, can also do voltaren/diclofenac gel or aspercreme before and after work for inflammation ( up to 3-4 times per day)    High-Fiber Eating Plan Fiber, also called dietary fiber, is a type of carbohydrate. It is found foods such as fruits, vegetables, whole grains, and beans. A high-fiber diet can have many health benefits. Your health care provider may recommend a high-fiber diet to help:  Prevent constipation. Fiber can make your bowel movements more regular.  Lower your cholesterol.  Relieve the following conditions: ? Inflammation of veins in the anus (hemorrhoids). ? Inflammation of specific areas of the digestive tract (uncomplicated diverticulosis). ? A problem of the large intestine, also called the colon, that sometimes causes pain and diarrhea (irritable bowel syndrome, or IBS).  Prevent overeating as part of a weight-loss plan.  Prevent heart disease, type 2 diabetes, and certain cancers. What are tips for following this plan? Reading food labels  Check the nutrition facts label on food products for the amount of dietary fiber. Choose foods that have 5 grams of fiber or more per serving.  The goals for recommended daily fiber intake include: ? Men (age 74 or younger): 34-38 g. ? Men (over age 15): 28-34 g. ? Women (age 56 or younger): 25-28 g. ? Women (over age 55): 22-25 g. Your daily fiber goal is  _____________ g.   Shopping  Choose whole fruits and vegetables instead of processed forms, such as apple juice or applesauce.  Choose a wide variety of high-fiber foods such as avocados, lentils, oats, and kidney beans.  Read the nutrition facts label of the foods you choose. Be aware of foods with added fiber. These foods often have high sugar and sodium amounts per serving. Cooking  Use whole-grain flour for baking and cooking.  Cook with brown rice instead of white rice. Meal planning  Start the day with a breakfast that is high in fiber, such as a cereal that contains 5 g of fiber or more per serving.  Eat breads and cereals that are made with whole-grain flour instead of refined flour or white flour.  Eat brown rice, bulgur wheat, or millet instead of white rice.  Use beans in place of meat in soups, salads, and pasta dishes.  Be sure that half of the grains you eat each day are whole grains. General information  You can get the recommended daily intake of dietary fiber by: ? Eating a variety of fruits, vegetables, grains, nuts, and beans. ? Taking a fiber supplement if you are not able to take in enough fiber in your diet. It is better to get fiber through food than from a supplement.  Gradually increase how much fiber you consume. If you increase your intake of dietary fiber too quickly, you may have bloating, cramping, or gas.  Drink plenty of water to help you digest fiber.  Choose high-fiber snacks, such as berries, raw vegetables, nuts, and popcorn. What foods  should I eat? Fruits Berries. Pears. Apples. Oranges. Avocado. Prunes and raisins. Dried figs. Vegetables Sweet potatoes. Spinach. Kale. Artichokes. Cabbage. Broccoli. Cauliflower. Green peas. Carrots. Squash. Grains Whole-grain breads. Multigrain cereal. Oats and oatmeal. Brown rice. Barley. Bulgur wheat. Millet. Quinoa. Bran muffins. Popcorn. Rye wafer crackers. Meats and other proteins Navy beans,  kidney beans, and pinto beans. Soybeans. Split peas. Lentils. Nuts and seeds. Dairy Fiber-fortified yogurt. Beverages Fiber-fortified soy milk. Fiber-fortified orange juice. Other foods Fiber bars. The items listed above may not be a complete list of recommended foods and beverages. Contact a dietitian for more information. What foods should I avoid? Fruits Fruit juice. Cooked, strained fruit. Vegetables Fried potatoes. Canned vegetables. Well-cooked vegetables. Grains White bread. Pasta made with refined flour. White rice. Meats and other proteins Fatty cuts of meat. Fried chicken or fried fish. Dairy Milk. Yogurt. Cream cheese. Sour cream. Fats and oils Butters. Beverages Soft drinks. Other foods Cakes and pastries. The items listed above may not be a complete list of foods and beverages to avoid. Talk with your dietitian about what choices are best for you. Summary  Fiber is a type of carbohydrate. It is found in foods such as fruits, vegetables, whole grains, and beans.  A high-fiber diet has many benefits. It can help to prevent constipation, lower blood cholesterol, aid weight loss, and reduce your risk of heart disease, diabetes, and certain cancers.  Increase your intake of fiber gradually. Increasing fiber too quickly may cause cramping, bloating, and gas. Drink plenty of water while you increase the amount of fiber you consume.  The best sources of fiber include whole fruits and vegetables, whole grains, nuts, seeds, and beans. This information is not intended to replace advice given to you by your health care provider. Make sure you discuss any questions you have with your health care provider. Document Revised: 09/22/2019 Document Reviewed: 09/22/2019 Elsevier Patient Education  2021 [employer].

## 2020-10-05 LAB — CBC WITH DIFFERENTIAL/PLATELET
Absolute Monocytes: 189 cells/uL — ABNORMAL LOW (ref 200–950)
Basophils Absolute: 48 cells/uL (ref 0–200)
Basophils Relative: 1.1 %
Eosinophils Absolute: 79 cells/uL (ref 15–500)
Eosinophils Relative: 1.8 %
HCT: 38.6 % (ref 35.0–45.0)
Hemoglobin: 12 g/dL (ref 11.7–15.5)
Lymphs Abs: 1342 cells/uL (ref 850–3900)
MCH: 26.2 pg — ABNORMAL LOW (ref 27.0–33.0)
MCHC: 31.1 g/dL — ABNORMAL LOW (ref 32.0–36.0)
MCV: 84.3 fL (ref 80.0–100.0)
MPV: 10.8 fL (ref 7.5–12.5)
Monocytes Relative: 4.3 %
Neutro Abs: 2741 cells/uL (ref 1500–7800)
Neutrophils Relative %: 62.3 %
Platelets: 289 10*3/uL (ref 140–400)
RBC: 4.58 10*6/uL (ref 3.80–5.10)
RDW: 18.7 % — ABNORMAL HIGH (ref 11.0–15.0)
Total Lymphocyte: 30.5 %
WBC: 4.4 10*3/uL (ref 3.8–10.8)

## 2020-10-05 LAB — IRON,TIBC AND FERRITIN PANEL
%SAT: 23 % (calc) (ref 16–45)
Ferritin: 15 ng/mL — ABNORMAL LOW (ref 16–232)
Iron: 85 ug/dL (ref 40–190)
TIBC: 371 mcg/dL (calc) (ref 250–450)

## 2020-10-05 LAB — H. PYLORI BREATH TEST: H. pylori Breath Test: NOT DETECTED

## 2020-12-27 ENCOUNTER — Other Ambulatory Visit: Payer: Self-pay

## 2020-12-27 ENCOUNTER — Ambulatory Visit
Admission: RE | Admit: 2020-12-27 | Discharge: 2020-12-27 | Disposition: A | Discharge status: Home / Self Care | Payer: BC Managed Care – PPO | Source: Ambulatory Visit | Attending: Adult Health | Admitting: Adult Health

## 2020-12-27 DIAGNOSIS — Z1231 Encounter for screening mammogram for malignant neoplasm of breast: Secondary | ICD-10-CM

## 2021-05-01 ENCOUNTER — Ambulatory Visit: Payer: BC Managed Care – PPO | Admitting: Adult Health

## 2021-05-01 ENCOUNTER — Other Ambulatory Visit: Payer: Self-pay

## 2021-05-01 VITALS — BP 98/62 | HR 73 | Temp 97.5°F | Wt 143.0 lb

## 2021-05-01 DIAGNOSIS — R21 Rash and other nonspecific skin eruption: Secondary | ICD-10-CM

## 2021-05-01 NOTE — Progress Notes (Signed)
Assessment and Plan:  Sherri Anthony was seen today for rash.  Diagnoses and all orders for this visit:  Rash and other nonspecific skin eruption Neck rash resolved by time of exam; ? Contact dermatitis from photo; continue to avoid herbal supplement; discussed risks of interactions Subtle pigment change of R hand without clear etiology, or significance.  She can take photo of area for records every month to track if any changes Hx of tinea versicolor but not typical If progressive changes follow up and will proceed with derm referral   Further disposition pending results of labs. Discussed med's effects and SE's.   Over 15 minutes of exam, counseling, chart review, and critical decision making was performed.   Future Appointments  Date Time Provider Department Center  06/21/2021  9:30 AM Judd Gaudier, NP [facility] None    ------------------------------------------------------------------------------------------------------------------   HPI BP 98/62   Pulse 73   Temp (!) 97.5 F (36.4 C)   Wt 143 lb (64.9 kg)   SpO2 99%   BMI 29.89 kg/m  45 y.o.female of [place] origin with hx RA on humira presents for evaluation of rash. She is accompanied by her husband. She reports red round itchy but not raised rash appeared suddenly to neck 3 weeks ago; she cannot recall new foods, products/topicals. She reports stopped gingko biloba supplement and rash quickly resolved. She is also concerned about pigment change in R hand around base of thumb.   Past Medical History:  Diagnosis Date   BMI 28.0-28.9,adult    H. pylori infection 06/01/2020   History of prediabetes 04/23/2018   Migraines    Seasonal allergies      No Known Allergies  Current Outpatient Medications on File Prior to Visit  Medication Sig   Adalimumab (HUMIRA PEN [place]) Inject into the skin. Every 2 weeks   Ferrous Sulfate (IRON SUPPLEMENT PO) Take 65 mg by mouth daily.   methotrexate (RHEUMATREX) 2.5 MG tablet Take 2.5 mg  by mouth once a week. Not currently taking   VITAMIN D, CHOLECALCIFEROL, PO Take 5,000 Units by mouth daily.   folic acid (FOLVITE) 1 MG tablet Take 1 mg by mouth daily. (Patient not taking: Reported on 05/01/2021)   ketoconazole (NIZORAL) 2 % cream Apply cream to rash once daily for 2-4 weeks. (Patient not taking: Reported on 10/04/2020)   Molnupiravir 200 MG CAPS TAKE 4 CAPSULES BY MOUTH 2 TIMES DAILY (Patient not taking: Reported on 10/04/2020)   omeprazole (PRILOSEC) 20 MG capsule Take 1 capsule (20 mg total) by mouth 2 (two) times daily before a meal for 14 days.   Ubiquinol 100 MG CAPS Take 1 capsule by mouth daily. (Patient not taking: Reported on 08/16/2020)   No current facility-administered medications on file prior to visit.    ROS: all negative except above.   Physical Exam:  BP 98/62   Pulse 73   Temp (!) 97.5 F (36.4 C)   Wt 143 lb (64.9 kg)   SpO2 99%   BMI 29.89 kg/m   General Appearance: Well nourished, in no apparent distress. Eyes: PERRLA, conjunctiva no swelling or erythema ENT/Mouth: mask in place; Hearing normal.  Neck: Supple, thyroid normal, non-tender.  Respiratory: Respiratory effort normal, BS equal bilaterally without rales, rhonchi, wheezing or stridor.  Cardio: RRR with no MRGs. Brisk peripheral pulses without edema.  Abdomen: Soft, + BS.  Non tender, no guarding, rebound, hernias, masses. Lymphatics: Non tender without lymphadenopathy.  Musculoskeletal: no obvious deformity; normal gait.  Skin: Warm, dry without rashes, lesions,  ecchymosis. She has scattered small spots to left forearm and bilateral lower legs/shins with pigment loss. Subtle lightening of pigment of R thumb base and proximal dorsal area without distinct borders. No rash or texture to neck.  Neuro: Normal muscle tone Psych: Awake and oriented X 3, normal affect, Insight and Judgment appropriate.    Dan Maker, NP 12:29 PM [facility]

## 2021-06-04 ENCOUNTER — Encounter: Payer: BC Managed Care – PPO | Admitting: Adult Health

## 2021-06-07 DIAGNOSIS — M2550 Pain in unspecified joint: Secondary | ICD-10-CM | POA: Diagnosis not present

## 2021-06-07 DIAGNOSIS — M79644 Pain in right finger(s): Secondary | ICD-10-CM | POA: Diagnosis not present

## 2021-06-07 DIAGNOSIS — M0609 Rheumatoid arthritis without rheumatoid factor, multiple sites: Secondary | ICD-10-CM | POA: Diagnosis not present

## 2021-06-20 NOTE — Progress Notes (Signed)
Complete Physical  Assessment and Plan:  Diagnoses and all orders for this visit:  Encounter for Annual Physical Exam with abnormal findings Due annually  Health Maintenance reviewed Healthy lifestyle reviewed and goals set  Menorrhagia with regular cycle Follows with Dr. Phineas Real;   Obesity - BMI 30 Long discussion about weight loss, diet, and exercise Recommended diet heavy in fruits and veggies and low in animal meats, cheeses, and dairy products, appropriate calorie intake Discussed appropriate weight for height, goal weight this year <135lb Follow up at next visit  Cholesterol Initiate medication if LDL persists 130+ Continue low cholesterol diet and exercise.  - lipid panel  - TSH   Seronegative RA (HCC)/bil thumb pain Follows with Leafy Kindle, rheumatology Improved with methotrexate   Iron deficiency anemia On supplement, recheck, menorrhagia, likely needs until menopause - CBC, iron/TIBC/ferritin  Other abnormal glucose (hx of prediabetes) Discussed disease and risks Discussed diet/exercise, weight management  -     Hemoglobin A1c  Screening for hematuria or proteinuria -     Urinalysis w microscopic + reflex cultur  Screening for thyroid disorder -     TSH  Vitamin D deficiency -     VITAMIN D 25 Hydroxy (Vit-D Deficiency, Fractures)  Medication management -     CBC with Differential/Platelet -     COMPLETE METABOLIC PANEL WITH GFR -     Magnesium  -     UA  Screening breast cancer Schedule mammogram  [facility] - 3D mammogram recommended  Rash/ tinea versicolor -     ketoconazole (NIZORAL) 2 % cream; Apply cream to rash once daily for 2-4 weeks.  Need for influenza vaccine Quadrivalent flu vaccine administered without complication today                                                                                  Orders Placed This Encounter  Procedures   CBC with Differential/Platelet   COMPLETE METABOLIC PANEL WITH GFR   Magnesium    Lipid panel   TSH   Hemoglobin A1c   VITAMIN D 25 Hydroxy (Vit-D Deficiency, Fractures)   Iron, TIBC and Ferritin Panel   Urinalysis, Routine w reflex microscopic     Discussed med's effects and SE's. Screening labs and tests as requested with regular follow-up as recommended. Over 40 minutes of exam, counseling, chart review, and complex, high level critical decision making was performed this visit.   Future Appointments  Date Time Provider [place]  06/24/2022  9:00 AM Liane Comber, NP [facility] None    HPI  46 y.o. female presents for a complete physical. She has Menorrhagia; Tinea versicolor; Overweight (BMI 25.0-29.9); Chronic thumb pain, bilateral; Other abnormal glucose (prediabetes); Hyperlipidemia; Vitamin D deficiency; Migraine without aura and without status migrainosus, not intractable; Seronegative rheumatoid arthritis ([place]); Iron deficiency anemia; and Costochondral chest pain on their problem list.   She immigrated from [place], now is a citizen, married with 2 children. Jacob Moores is in Corporate treasurer, living in [place] right now. 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 No grandkids yet. 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  No concerns.  She is following annually with GYN Dr. Phineas Real for PAP  neg HPV 05/2018, follows annually.  Getting mammograms nnually at [facility] center, [facility], last 11/2020.   She is following with Leafy Kindle at [place] and diagnosed with seronegative RA, no longer on methotrexate, newly on adalimumab. Follows with ortho Dr. Bess Harvest as needed as well.   She reports seasonal allergies, typically during fall and winter for which she takes OTC medication PRN.   Hx of migraines, L sided, without aura but with photosensitivity, this is rare, only had 1 last year, have improved recently and typically well controlled by PRN tylenol.   She has had rash (white spots on hands/forearms, ankles) suspect for tinea versicolor; limited benefit  with selsun blue, now prescribed ketoconale and sees benefit with this.   BMI is Body mass index is 30.35 kg/m., she has been working on diet Admits to less exercise, less No weight goal, just wants to lose weight.  No alcohol intake.  Breakfast: croissant sandwich Lunch: spring rolls, fried rice  Wt Readings from Last 3 Encounters:  06/21/21 145 lb 3.2 oz (65.9 kg)  05/01/21 143 lb (64.9 kg)  10/04/20 141 lb (64 kg)   Today their BP is BP: 120/78 She does not workout. Denies CP, dyspnea, dizziness.    She is not on cholesterol medication and denies myalgias. Her cholesterol is not at goal. The cholesterol last visit was:   Lab Results  Component Value Date   CHOL 204 (H) 05/31/2020   HDL 72 05/31/2020   LDLCALC 112 (H) 05/31/2020   TRIG 98 05/31/2020   CHOLHDL 2.8 05/31/2020    She has been working on diet and exercise for glucose management (hx of prediabetes, A1C 5.8% in 2019). Last A1C in the office was:  Lab Results  Component Value Date   HGBA1C 5.8 (H) 05/31/2020   Patient is on Vitamin D supplement, ? 5000 IU daily    Lab Results  Component Value Date   VD25OH 23 (L) 05/31/2020     Hx of menorrhagia, intermittent iron def, was recommended iron supplement last visit, taking 65 mg slow release iron daily  Lab Results  Component Value Date   IRON 85 10/04/2020   TIBC 371 10/04/2020   FERRITIN 15 (L) 10/04/2020    Current Medications:  Current Outpatient Medications on File Prior to Visit  Medication Sig Dispense Refill   Adalimumab (HUMIRA PEN [place]) Inject into the skin. Every 2 weeks     Ferrous Sulfate (IRON SUPPLEMENT PO) Take 65 mg by mouth daily.     ketoconazole (NIZORAL) 2 % cream Apply cream to rash once daily for 2-4 weeks. 60 g 0   VITAMIN D, CHOLECALCIFEROL, PO Take 5,000 Units by mouth daily.     folic acid (FOLVITE) 1 MG tablet Take 1 mg by mouth daily. (Patient not taking: Reported on 05/01/2021)     methotrexate (RHEUMATREX) 2.5 MG tablet Take 2.5  mg by mouth once a week. Not currently taking (Patient not taking: Reported on 06/21/2021)     Molnupiravir 200 MG CAPS TAKE 4 CAPSULES BY MOUTH 2 TIMES DAILY (Patient not taking: Reported on 10/04/2020) 40 capsule 0   omeprazole (PRILOSEC) 20 MG capsule Take 1 capsule (20 mg total) by mouth 2 (two) times daily before a meal for 14 days. 28 capsule 0   Ubiquinol 100 MG CAPS Take 1 capsule by mouth daily. (Patient not taking: Reported on 08/16/2020)     No current facility-administered medications on file prior to visit.   Allergies:  No Known  Allergies Medical History:  She has Menorrhagia; Tinea versicolor; Overweight (BMI 25.0-29.9); Chronic thumb pain, bilateral; Other abnormal glucose (prediabetes); Hyperlipidemia; Vitamin D deficiency; Migraine without aura and without status migrainosus, not intractable; Seronegative rheumatoid arthritis ([place]); Iron deficiency anemia; and Costochondral chest pain on their problem list. Health Maintenance:   Immunization History  Administered Date(s) Administered   Influenza-Unspecified 03/01/2020   PPD Test 04/22/2018   Td 04/22/2018    Tetanus: 2019 Pneumovax: -  Flu vaccine: 02/2020 Covid 19: 2/2, 2021, moderna - please   LMP: No LMP recorded. Pap: 05/2018 - neg/no HPV - Dr. Pamala Hurry  Last Dental Exam: Last 2020 - encouraged to schedule Last Eye Exam: Last 2021, glasses   Patient Care Team: Unk Pinto, MD as PCP - General (Internal Medicine)  Surgical History:  She has a past surgical history that includes Cesarean section and Tubal ligation (Bilateral). Family History:  Herfamily history includes Anemia in her mother; Diabetes in her father, paternal grandfather, and paternal grandmother; Heart disease in her father; Kidney Stones in her father; Migraines in her mother; Obesity in her father. Social History:  She reports that she has never smoked. She has never used smokeless tobacco. She reports that she does not drink alcohol and  does not use drugs.  Review of Systems: Review of Systems  Constitutional:  Negative for chills, fever, malaise/fatigue and weight loss.  HENT:  Negative for congestion, hearing loss, nosebleeds, sinus pain, sore throat and tinnitus.   Eyes:  Negative for blurred vision and double vision.  Respiratory:  Positive for cough (persistsent dry cough, only at work). Negative for hemoptysis, sputum production, shortness of breath and wheezing.   Cardiovascular:  Negative for chest pain, palpitations, orthopnea, claudication and leg swelling.  Gastrointestinal:  Negative for abdominal pain, blood in stool, constipation, diarrhea, heartburn, melena, nausea and vomiting.  Genitourinary: Negative.   Musculoskeletal:  Positive for joint pain (R thumb, L knee). Negative for back pain, myalgias and neck pain.  Skin:  Negative for rash (intermittent over her neck).  Neurological:  Negative for dizziness, tingling, sensory change, weakness and headaches.  Endo/Heme/Allergies:  Negative for environmental allergies and polydipsia.  Psychiatric/Behavioral:  Negative for depression, memory loss, substance abuse and suicidal ideas. The patient is not nervous/anxious and does not have insomnia.   All other systems reviewed and are negative.  Physical Exam: Estimated body mass index is 30.35 kg/m as calculated from the following:   Height as of this encounter: 4\' 10"  (1.473 m).   Weight as of this encounter: 145 lb 3.2 oz (65.9 kg). BP 120/78    Pulse 68    Temp 97.9 F (36.6 C)    Resp 16    Ht 4\' 10"  (1.473 m)    Wt 145 lb 3.2 oz (65.9 kg)    SpO2 98%    BMI 30.35 kg/m  General Appearance: Well nourished, in no apparent distress.  Eyes: PERRLA, EOMs, conjunctiva no swelling or erythema ENT/Mouth: Ext aud canals clear, normal light reflex with TMs without erythema, bulging. Good dentition. No erythema, swelling, or exudate on post pharynx. Tonsils not swollen or erythematous. Hearing normal.  Neck: Supple,  thyroid normal. No bruits  Respiratory: Respiratory effort normal, BS present/clear throughout without rales, rhonchi, wheezing or stridor.  Cardio: RRR without murmurs, rubs or gallops. Brisk peripheral pulses without edema.  Chest: symmetric, with normal excursions and percussion.  Breasts: Defer to GYN Abdomen: Soft, nontender (indicates epigastric area as location of episodic pain), no guarding, rebound, hernias,  masses, or organomegaly.  Lymphatics: Non tender without lymphadenopathy.  Genitourinary: Defer to GYN Musculoskeletal: Full ROM all peripheral extremities, bilateral knees with crepitus, no effusion, heat, laxity, 5/5 strength throughout, and normal gait.  Skin: Warm, dry without lesions, Ecchymosis. She has faint patchy/spots lightening of skin without raised lesions or discharge to bil hands/forearms, ankles, improved Neuro: Cranial nerves intact, reflexes equal bilaterally. Normal muscle tone, no cerebellar symptoms. Sensation intact.  Psych: Awake and oriented X 3, normal affect, Insight and Judgment appropriate.   EKG: NSR, no ST changes in 2021, defer  Gorden Harms Neiva Maenza 10:04 AM [facility]

## 2021-06-21 ENCOUNTER — Other Ambulatory Visit: Payer: Self-pay

## 2021-06-21 ENCOUNTER — Ambulatory Visit (INDEPENDENT_AMBULATORY_CARE_PROVIDER_SITE_OTHER): Payer: BC Managed Care – PPO | Admitting: Adult Health

## 2021-06-21 ENCOUNTER — Encounter: Payer: Self-pay | Admitting: Adult Health

## 2021-06-21 VITALS — BP 120/78 | HR 68 | Temp 97.9°F | Resp 16 | Ht <= 58 in | Wt 145.2 lb

## 2021-06-21 DIAGNOSIS — Z1389 Encounter for screening for other disorder: Secondary | ICD-10-CM | POA: Diagnosis not present

## 2021-06-21 DIAGNOSIS — Z130 Encounter for screening for diseases of the blood and blood-forming organs and certain disorders involving the immune mechanism: Secondary | ICD-10-CM | POA: Diagnosis not present

## 2021-06-21 DIAGNOSIS — E559 Vitamin D deficiency, unspecified: Secondary | ICD-10-CM

## 2021-06-21 DIAGNOSIS — E663 Overweight: Secondary | ICD-10-CM

## 2021-06-21 DIAGNOSIS — G43009 Migraine without aura, not intractable, without status migrainosus: Secondary | ICD-10-CM

## 2021-06-21 DIAGNOSIS — Z131 Encounter for screening for diabetes mellitus: Secondary | ICD-10-CM

## 2021-06-21 DIAGNOSIS — Z79899 Other long term (current) drug therapy: Secondary | ICD-10-CM | POA: Diagnosis not present

## 2021-06-21 DIAGNOSIS — Z0001 Encounter for general adult medical examination with abnormal findings: Secondary | ICD-10-CM

## 2021-06-21 DIAGNOSIS — Z13220 Encounter for screening for lipoid disorders: Secondary | ICD-10-CM

## 2021-06-21 DIAGNOSIS — E785 Hyperlipidemia, unspecified: Secondary | ICD-10-CM

## 2021-06-21 DIAGNOSIS — Z0000 Encounter for general adult medical examination without abnormal findings: Secondary | ICD-10-CM

## 2021-06-21 DIAGNOSIS — D509 Iron deficiency anemia, unspecified: Secondary | ICD-10-CM

## 2021-06-21 DIAGNOSIS — M0600 Rheumatoid arthritis without rheumatoid factor, unspecified site: Secondary | ICD-10-CM

## 2021-06-21 DIAGNOSIS — Z23 Encounter for immunization: Secondary | ICD-10-CM

## 2021-06-21 DIAGNOSIS — R7309 Other abnormal glucose: Secondary | ICD-10-CM

## 2021-06-21 DIAGNOSIS — N924 Excessive bleeding in the premenopausal period: Secondary | ICD-10-CM

## 2021-06-21 NOTE — Addendum Note (Signed)
Addended by: Arnaldo Natal on: 06/21/2021 10:28 AM   Modules accepted: Orders

## 2021-06-21 NOTE — Patient Instructions (Addendum)
Sherri Anthony , Thank you for taking time to come for your Annual Wellness Visit. I appreciate your ongoing commitment to your health goals. Please review the following plan we discussed and let me know if I can assist you in the future.   These are the goals we discussed:  Goals      HEMOGLOBIN A1C < 5.7     LDL CALC < 100     Weight (lb) < 135 lb (61.2 kg)     Please work to lose weight, this will help with prediabetes        This is a list of the screening recommended for you and due dates:  Health Maintenance  Topic Date Due   COVID-19 Vaccine (1) Never done   Flu Shot  12/31/2020   Pap Smear  05/20/2021   Mammogram  12/27/2021   Tetanus Vaccine  04/22/2028   HPV Vaccine  Aged Out   Hepatitis C Screening: USPSTF Recommendation to screen - Ages 18-79 yo.  Discontinued   HIV Screening  Discontinued     Prediabetes Eating Plan Prediabetes is a condition that causes blood sugar (glucose) levels to be higher than normal. This increases the risk for developing type 2 diabetes (type 2 diabetes mellitus). Working with a health care provider or nutrition specialist (dietitian) to make diet and lifestyle changes can help prevent the onset of diabetes. These changes may help you: Control your blood glucose levels. Improve your cholesterol levels. Manage your blood pressure. What are tips for following this plan? Reading food labels Read food labels to check the amount of fat, salt (sodium), and sugar in prepackaged foods. Avoid foods that have: Saturated fats. Trans fats. Added sugars. Avoid foods that have more than 300 milligrams (mg) of sodium per serving. Limit your sodium intake to less than 2,300 mg each day. Shopping Avoid buying pre-made and processed foods. Avoid buying drinks with added sugar. Cooking Cook with olive oil. Do not use butter, lard, or ghee. Bake, broil, grill, steam, or boil foods. Avoid frying. Meal planning  Work with your dietitian to create an  eating plan that is right for you. This may include tracking how many calories you take in each day. Use a food diary, notebook, or mobile application to track what you eat at each meal. Consider following a Mediterranean diet. This includes: Eating several servings of fresh fruits and vegetables each day. Eating fish at least twice a week. Eating one serving each day of whole grains, beans, nuts, and seeds. Using olive oil instead of other fats. Limiting alcohol. Limiting red meat. Using nonfat or low-fat dairy products. Consider following a plant-based diet. This includes dietary choices that focus on eating mostly vegetables and fruit, grains, beans, nuts, and seeds. If you have high blood pressure, you may need to limit your sodium intake or follow a diet such as the DASH (Dietary Approaches to Stop Hypertension) eating plan. The DASH diet aims to lower high blood pressure. Lifestyle Set weight loss goals with help from your health care team. It is recommended that most people with prediabetes lose 7% of their body weight. Exercise for at least 30 minutes 5 or more days a week. Attend a support group or seek support from a mental health counselor. Take over-the-counter and prescription medicines only as told by your health care provider. What foods are recommended? Fruits Berries. Bananas. Apples. Oranges. Grapes. Papaya. Mango. Pomegranate. Kiwi. Grapefruit. Cherries. Vegetables Lettuce. Spinach. Peas. Beets. Cauliflower. Cabbage. Broccoli. Carrots. Tomatoes. Squash.  Eggplant. Herbs. Peppers. Onions. Cucumbers. Brussels sprouts. Grains Whole grains, such as whole-wheat or whole-grain breads, crackers, cereals, and pasta. Unsweetened oatmeal. Bulgur. Barley. Quinoa. Brown rice. Corn or whole-wheat flour tortillas or taco shells. Meats and other proteins Seafood. Poultry without skin. Lean cuts of pork and beef. Tofu. Eggs. Nuts. Beans. Dairy Low-fat or fat-free dairy products, such as  yogurt, cottage cheese, and cheese. Beverages Water. Tea. Coffee. Sugar-free or diet soda. Seltzer water. Low-fat or nonfat milk. Milk alternatives, such as soy or almond milk. Fats and oils Olive oil. Canola oil. Sunflower oil. Grapeseed oil. Avocado. Walnuts. Sweets and desserts Sugar-free or low-fat pudding. Sugar-free or low-fat ice cream and other frozen treats. Seasonings and condiments Herbs. Sodium-free spices. Mustard. Relish. Low-salt, low-sugar ketchup. Low-salt, low-sugar barbecue sauce. Low-fat or fat-free mayonnaise. The items listed above may not be a complete list of recommended foods and beverages. Contact a dietitian for more information. What foods are not recommended? Fruits Fruits canned with syrup. Vegetables Canned vegetables. Frozen vegetables with butter or cream sauce. Grains Refined white flour and flour products, such as bread, pasta, snack foods, and cereals. Meats and other proteins Fatty cuts of meat. Poultry with skin. Breaded or fried meat. Processed meats. Dairy Full-fat yogurt, cheese, or milk. Beverages Sweetened drinks, such as iced tea and soda. Fats and oils Butter. Lard. Ghee. Sweets and desserts Baked goods, such as cake, cupcakes, pastries, cookies, and cheesecake. Seasonings and condiments Spice mixes with added salt. Ketchup. Barbecue sauce. Mayonnaise. The items listed above may not be a complete list of foods and beverages that are not recommended. Contact a dietitian for more information. Where to find more information American Diabetes Association: www.diabetes.org Summary You may need to make diet and lifestyle changes to help prevent the onset of diabetes. These changes can help you control blood sugar, improve cholesterol levels, and manage blood pressure. Set weight loss goals with help from your health care team. It is recommended that most people with prediabetes lose 7% of their body weight. Consider following a Mediterranean  diet. This includes eating plenty of fresh fruits and vegetables, whole grains, beans, nuts, seeds, fish, and low-fat dairy, and using olive oil instead of other fats. This information is not intended to replace advice given to you by your health care provider. Make sure you discuss any questions you have with your health care provider. Document Revised: 08/18/2019 Document Reviewed: 08/18/2019 Elsevier Patient Education  2022 [employer].

## 2021-06-22 ENCOUNTER — Other Ambulatory Visit: Payer: Self-pay | Admitting: Adult Health

## 2021-06-22 DIAGNOSIS — R7989 Other specified abnormal findings of blood chemistry: Secondary | ICD-10-CM

## 2021-06-22 LAB — URINALYSIS, ROUTINE W REFLEX MICROSCOPIC
Bilirubin Urine: NEGATIVE
Glucose, UA: NEGATIVE
Hgb urine dipstick: NEGATIVE
Ketones, ur: NEGATIVE
Leukocytes,Ua: NEGATIVE
Nitrite: NEGATIVE
Protein, ur: NEGATIVE
Specific Gravity, Urine: 1.021 (ref 1.001–1.035)
pH: 6.5 (ref 5.0–8.0)

## 2021-06-22 LAB — LIPID PANEL
Cholesterol: 277 mg/dL — ABNORMAL HIGH (ref <200)
HDL: 74 mg/dL (ref >=50)
LDL Cholesterol (Calc): 175 mg/dL (calc) — ABNORMAL HIGH
Non-HDL Cholesterol (Calc): 203 mg/dL (calc) — ABNORMAL HIGH (ref <130)
Total CHOL/HDL Ratio: 3.7 (calc) (ref <5.0)
Triglycerides: 142 mg/dL (ref <150)

## 2021-06-22 LAB — IRON,TIBC AND FERRITIN PANEL
%SAT: 22 % (calc) (ref 16–45)
Ferritin: 37 ng/mL (ref 16–232)
Iron: 78 ug/dL (ref 40–190)
TIBC: 354 mcg/dL (calc) (ref 250–450)

## 2021-06-22 LAB — CBC WITH DIFFERENTIAL/PLATELET
Absolute Monocytes: 372 cells/uL (ref 200–950)
Basophils Absolute: 62 cells/uL (ref 0–200)
Basophils Relative: 1 %
Eosinophils Absolute: 93 cells/uL (ref 15–500)
Eosinophils Relative: 1.5 %
HCT: 41.4 % (ref 35.0–45.0)
Hemoglobin: 13.9 g/dL (ref 11.7–15.5)
Lymphs Abs: 2071 cells/uL (ref 850–3900)
MCH: 29.5 pg (ref 27.0–33.0)
MCHC: 33.6 g/dL (ref 32.0–36.0)
MCV: 87.9 fL (ref 80.0–100.0)
MPV: 10.7 fL (ref 7.5–12.5)
Monocytes Relative: 6 %
Neutro Abs: 3602 cells/uL (ref 1500–7800)
Neutrophils Relative %: 58.1 %
Platelets: 229 10*3/uL (ref 140–400)
RBC: 4.71 10*6/uL (ref 3.80–5.10)
RDW: 13.6 % (ref 11.0–15.0)
Total Lymphocyte: 33.4 %
WBC: 6.2 10*3/uL (ref 3.8–10.8)

## 2021-06-22 LAB — COMPLETE METABOLIC PANEL WITH GFR
AG Ratio: 1.6 (calc) (ref 1.0–2.5)
ALT: 32 U/L — ABNORMAL HIGH (ref 6–29)
AST: 18 U/L (ref 10–30)
Albumin: 4.4 g/dL (ref 3.6–5.1)
Alkaline phosphatase (APISO): 42 U/L (ref 31–125)
BUN: 15 mg/dL (ref 7–25)
CO2: 26 mmol/L (ref 20–32)
Calcium: 9.3 mg/dL (ref 8.6–10.2)
Chloride: 107 mmol/L (ref 98–110)
Creat: 0.52 mg/dL (ref 0.50–0.99)
Globulin: 2.8 g/dL (calc) (ref 1.9–3.7)
Glucose, Bld: 95 mg/dL (ref 65–99)
Potassium: 4.2 mmol/L (ref 3.5–5.3)
Sodium: 140 mmol/L (ref 135–146)
Total Bilirubin: 0.2 mg/dL (ref 0.2–1.2)
Total Protein: 7.2 g/dL (ref 6.1–8.1)
eGFR: 117 mL/min/{1.73_m2} (ref >=60)

## 2021-06-22 LAB — TSH: TSH: 1.9 mIU/L

## 2021-06-22 LAB — HEMOGLOBIN A1C
Hgb A1c MFr Bld: 5.6 % of total Hgb (ref <5.7)
Mean Plasma Glucose: 114 mg/dL
eAG (mmol/L): 6.3 mmol/L

## 2021-06-22 LAB — VITAMIN D 25 HYDROXY (VIT D DEFICIENCY, FRACTURES): Vit D, 25-Hydroxy: 41 ng/mL (ref 30–100)

## 2021-06-22 LAB — MAGNESIUM: Magnesium: 1.9 mg/dL (ref 1.5–2.5)

## 2021-06-22 MED ORDER — FERROUS SULFATE 325 (65 FE) MG PO TABS: ORAL_TABLET | ORAL | 0 refills | Status: AC

## 2021-08-08 ENCOUNTER — Ambulatory Visit (INDEPENDENT_AMBULATORY_CARE_PROVIDER_SITE_OTHER): Payer: BC Managed Care – PPO

## 2021-08-08 ENCOUNTER — Other Ambulatory Visit: Payer: Self-pay

## 2021-08-08 DIAGNOSIS — R7989 Other specified abnormal findings of blood chemistry: Secondary | ICD-10-CM | POA: Diagnosis not present

## 2021-08-08 NOTE — Progress Notes (Signed)
Patient is here to have labs done to check Liver Function and do a weight check. Today, patient weighed 145.2 lbs. ?No questions or concerns.   ?

## 2021-08-09 LAB — HEPATIC FUNCTION PANEL
AG Ratio: 1.4 (calc) (ref 1.0–2.5)
ALT: 22 U/L (ref 6–29)
AST: 17 U/L (ref 10–30)
Albumin: 4.3 g/dL (ref 3.6–5.1)
Alkaline phosphatase (APISO): 47 U/L (ref 31–125)
Bilirubin, Direct: 0.1 mg/dL (ref 0.0–0.2)
Globulin: 3 g/dL (calc) (ref 1.9–3.7)
Indirect Bilirubin: 0.4 mg/dL (calc) (ref 0.2–1.2)
Total Bilirubin: 0.5 mg/dL (ref 0.2–1.2)
Total Protein: 7.3 g/dL (ref 6.1–8.1)

## 2021-10-10 DIAGNOSIS — M2550 Pain in unspecified joint: Secondary | ICD-10-CM | POA: Diagnosis not present

## 2021-10-10 DIAGNOSIS — M79644 Pain in right finger(s): Secondary | ICD-10-CM | POA: Diagnosis not present

## 2021-10-10 DIAGNOSIS — M0609 Rheumatoid arthritis without rheumatoid factor, multiple sites: Secondary | ICD-10-CM | POA: Diagnosis not present

## 2021-12-20 ENCOUNTER — Ambulatory Visit: Payer: BC Managed Care – PPO | Admitting: Nurse Practitioner

## 2021-12-26 DIAGNOSIS — M0609 Rheumatoid arthritis without rheumatoid factor, multiple sites: Secondary | ICD-10-CM | POA: Diagnosis not present

## 2021-12-26 DIAGNOSIS — M2550 Pain in unspecified joint: Secondary | ICD-10-CM | POA: Diagnosis not present

## 2021-12-26 DIAGNOSIS — M79644 Pain in right finger(s): Secondary | ICD-10-CM | POA: Diagnosis not present

## 2022-01-01 NOTE — Progress Notes (Unsigned)
6 MONTH FOLLOW UP  Assessment and Plan:  Diagnoses and all orders for this visit:   Obesity - BMI 30 Long discussion about weight loss, diet, and exercise Recommended diet heavy in fruits and veggies and low in animal meats, cheeses, and dairy products, appropriate calorie intake Discussed appropriate weight for height, goal weight this year <135lb Follow up at next visit - TSH  Cholesterol Initiate medication if LDL persists 130+ Continue low cholesterol diet and exercise.  - lipid panel    Seronegative RA (HCC)/bil thumb pain Follows with Azucena Fallen, rheumatology Improved with methotrexate   Iron deficiency anemia On supplement, recheck, menorrhagia, likely needs until menopause - CBC  Other abnormal glucose (hx of prediabetes) Discussed disease and risks Discussed diet/exercise, weight management  -     CMP    Medication management -     TSH         Discussed med's effects and SE's. Screening labs and tests as requested with regular follow-up as recommended. Over 40 minutes of exam, counseling, chart review, and complex, high level critical decision making was performed this visit.   Future Appointments  Date Time Provider Department Center  01/02/2022  2:30 PM Raynelle Dick, NP [facility] None  06/24/2022  9:00 AM Raynelle Dick, NP [facility] None    HPI  46 y.o. female presents for a complete physical. She has Menorrhagia; Tinea versicolor; Overweight (BMI 25.0-29.9); Chronic thumb pain, bilateral; Other abnormal glucose (prediabetes); Hyperlipidemia; Vitamin D deficiency; Migraine without aura and without status migrainosus, not intractable; Seronegative rheumatoid arthritis (HCC); Iron deficiency anemia; Costochondral chest pain; and Elevated LFTs on their problem list.   She immigrated from [place], now is a citizen, married with 2 children. 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 No  grandkids yet. 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    She is following with Azucena Fallen at [facility] and diagnosed with seronegative RA, no longer on methotrexate, newly on adalimumab. Follows with ortho Dr. Urban Gibson as needed as well.   She reports seasonal allergies, typically during fall and winter for which she takes OTC medication PRN.   Hx of migraines, L sided, without aura but with photosensitivity, this is rare, only had 1 last year, have improved recently and typically well controlled by PRN tylenol.    BMI is There is no height or weight on file to calculate BMI., she has been working on diet Admits to less exercise, less No weight goal, just wants to lose weight.  No alcohol intake.  Breakfast: croissant sandwich Lunch: spring rolls, fried rice  Wt Readings from Last 3 Encounters:  08/08/21 145 lb 3.2 oz (65.9 kg)  06/21/21 145 lb 3.2 oz (65.9 kg)  05/01/21 143 lb (64.9 kg)   Today their BP is   She does not workout. Denies CP, dyspnea, dizziness.    She is not on cholesterol medication and denies myalgias. Her cholesterol is not at goal. The cholesterol last visit was:   Lab Results  Component Value Date   CHOL 277 (H) 06/21/2021   HDL 74 06/21/2021   LDLCALC 175 (H) 06/21/2021   TRIG 142 06/21/2021   CHOLHDL 3.7 06/21/2021    She has been working on diet and exercise for glucose management (hx of prediabetes, A1C 5.8% in 2019). Last A1C in the office was:  Lab Results  Component Value Date   HGBA1C 5.6 06/21/2021   Patient is on Vitamin  D supplement, ? 5000 IU daily    Lab Results  Component Value Date   VD25OH 27 06/21/2021     Hx of menorrhagia, intermittent iron def, was recommended iron supplement last visit, taking 65 mg slow release iron daily  Lab Results  Component Value Date   IRON 78 06/21/2021   TIBC 354 06/21/2021   FERRITIN 37 06/21/2021    Current Medications:  Current Outpatient Medications on File Prior to Visit  Medication  Sig Dispense Refill   Adalimumab (HUMIRA PEN ) Inject into the skin. Every 2 weeks     ferrous sulfate 325 (65 FE) MG tablet Take 1 tab every other day with vitamin C rich foods to help prevent anemia. 30 tablet 0   ketoconazole (NIZORAL) 2 % cream Apply cream to rash once daily for 2-4 weeks. 60 g 0   VITAMIN D, CHOLECALCIFEROL, PO Take 5,000 Units by mouth daily.     No current facility-administered medications on file prior to visit.   Allergies:  No Known Allergies Medical History:  She has Menorrhagia; Tinea versicolor; Overweight (BMI 25.0-29.9); Chronic thumb pain, bilateral; Other abnormal glucose (prediabetes); Hyperlipidemia; Vitamin D deficiency; Migraine without aura and without status migrainosus, not intractable; Seronegative rheumatoid arthritis (HCC); Iron deficiency anemia; Costochondral chest pain; and Elevated LFTs on their problem list. Health Maintenance:   Immunization History  Administered Date(s) Administered   Influenza,inj,Quad PF,6+ Mos 06/21/2021   Influenza-Unspecified 03/01/2020   PPD Test 04/22/2018   Td 04/22/2018    Tetanus: 2019 Pneumovax: -  Flu vaccine: 02/2020 Covid 19: 2/2, 2021, moderna - please   LMP: No LMP recorded. Pap: 05/2018 - neg/no HPV - Dr. Ernestina Penna  Last Dental Exam: Last 2020 - encouraged to schedule Last Eye Exam: Last 2021, glasses   Patient Care Team: Lucky Cowboy, MD as PCP - General (Internal Medicine)  Surgical History:  She has a past surgical history that includes Cesarean section and Tubal ligation (Bilateral). Family History:  Herfamily history includes Anemia in her mother; Diabetes in her father, paternal grandfather, and paternal grandmother; Heart disease in her father; Kidney Stones in her father; Migraines in her mother; Obesity in her father. Social History:  She reports that she has never smoked. She has never used smokeless tobacco. She reports that she does not drink alcohol and does not use  drugs.  Review of Systems: Review of Systems  Constitutional:  Negative for chills, fever and weight loss.  HENT:  Negative for congestion and hearing loss.   Eyes:  Negative for blurred vision and double vision.  Respiratory:  Negative for cough and shortness of breath.   Cardiovascular:  Negative for chest pain, palpitations, orthopnea and leg swelling.  Gastrointestinal:  Negative for abdominal pain, constipation, diarrhea, heartburn, nausea and vomiting.  Musculoskeletal:  Negative for falls, joint pain and myalgias.  Skin:  Negative for rash.  Neurological:  Negative for dizziness, tingling, tremors, loss of consciousness and headaches.  Psychiatric/Behavioral:  Negative for depression, memory loss and suicidal ideas.     Physical Exam: Estimated body mass index is 30.35 kg/m as calculated from the following:   Height as of 08/08/21: 4\' 10"  (1.473 m).   Weight as of 08/08/21: 145 lb 3.2 oz (65.9 kg). There were no vitals taken for this visit. General Appearance: Well nourished, in no apparent distress.  Eyes: PERRLA, EOMs, conjunctiva no swelling or erythema ENT/Mouth: Ext aud canals clear, normal light reflex with TMs without erythema, bulging. Good dentition. No erythema, swelling, or  exudate on post pharynx. Tonsils not swollen or erythematous. Hearing normal.  Neck: Supple, thyroid normal. No bruits  Respiratory: Respiratory effort normal, BS present/clear throughout without rales, rhonchi, wheezing or stridor.  Cardio: RRR without murmurs, rubs or gallops. Brisk peripheral pulses without edema.  Chest: symmetric, with normal excursions and percussion.  Breasts: Defer to GYN Abdomen: Soft, nontender (indicates epigastric area as location of episodic pain), no guarding, rebound, hernias, masses, or organomegaly.  Lymphatics: Non tender without lymphadenopathy.  Genitourinary: Defer to GYN Musculoskeletal: Full ROM all peripheral extremities, bilateral knees with crepitus, no  effusion, heat, laxity, 5/5 strength throughout, and normal gait.  Skin: Warm, dry without lesions, Ecchymosis. She has faint patchy/spots lightening of skin without raised lesions or discharge to bil hands/forearms, ankles, improved Neuro: Cranial nerves intact, reflexes equal bilaterally. Normal muscle tone, no cerebellar symptoms. Sensation intact.  Psych: Awake and oriented X 3, normal affect, Insight and Judgment appropriate.   EKG: NSR, no ST changes in 2021, defer  Eppie Barhorst E  1:37 PM [facility]

## 2022-01-02 ENCOUNTER — Ambulatory Visit: Payer: BC Managed Care – PPO | Admitting: Nurse Practitioner

## 2022-01-02 ENCOUNTER — Encounter: Payer: Self-pay | Admitting: Nurse Practitioner

## 2022-01-02 VITALS — BP 102/62 | HR 79 | Temp 97.7°F | Ht <= 58 in | Wt 148.0 lb

## 2022-01-02 DIAGNOSIS — Z79899 Other long term (current) drug therapy: Secondary | ICD-10-CM

## 2022-01-02 DIAGNOSIS — R7309 Other abnormal glucose: Secondary | ICD-10-CM | POA: Diagnosis not present

## 2022-01-02 DIAGNOSIS — E663 Overweight: Secondary | ICD-10-CM | POA: Diagnosis not present

## 2022-01-02 DIAGNOSIS — M0600 Rheumatoid arthritis without rheumatoid factor, unspecified site: Secondary | ICD-10-CM | POA: Diagnosis not present

## 2022-01-02 DIAGNOSIS — E785 Hyperlipidemia, unspecified: Secondary | ICD-10-CM | POA: Diagnosis not present

## 2022-01-02 DIAGNOSIS — D509 Iron deficiency anemia, unspecified: Secondary | ICD-10-CM

## 2022-01-02 DIAGNOSIS — E559 Vitamin D deficiency, unspecified: Secondary | ICD-10-CM

## 2022-01-03 LAB — CBC WITH DIFFERENTIAL/PLATELET
Absolute Monocytes: 360 cells/uL (ref 200–950)
Basophils Absolute: 49 cells/uL (ref 0–200)
Basophils Relative: 0.8 %
Eosinophils Absolute: 98 cells/uL (ref 15–500)
Eosinophils Relative: 1.6 %
HCT: 39.6 % (ref 35.0–45.0)
Hemoglobin: 13.3 g/dL (ref 11.7–15.5)
Lymphs Abs: 1922 cells/uL (ref 850–3900)
MCH: 29.2 pg (ref 27.0–33.0)
MCHC: 33.6 g/dL (ref 32.0–36.0)
MCV: 86.8 fL (ref 80.0–100.0)
MPV: 11 fL (ref 7.5–12.5)
Monocytes Relative: 5.9 %
Neutro Abs: 3672 cells/uL (ref 1500–7800)
Neutrophils Relative %: 60.2 %
Platelets: 255 10*3/uL (ref 140–400)
RBC: 4.56 10*6/uL (ref 3.80–5.10)
RDW: 12.1 % (ref 11.0–15.0)
Total Lymphocyte: 31.5 %
WBC: 6.1 10*3/uL (ref 3.8–10.8)

## 2022-01-03 LAB — COMPLETE METABOLIC PANEL WITH GFR
AG Ratio: 1.4 (calc) (ref 1.0–2.5)
ALT: 13 U/L (ref 6–29)
AST: 15 U/L (ref 10–30)
Albumin: 4.2 g/dL (ref 3.6–5.1)
Alkaline phosphatase (APISO): 50 U/L (ref 31–125)
BUN: 11 mg/dL (ref 7–25)
CO2: 26 mmol/L (ref 20–32)
Calcium: 9.2 mg/dL (ref 8.6–10.2)
Chloride: 105 mmol/L (ref 98–110)
Creat: 0.56 mg/dL (ref 0.50–0.99)
Globulin: 2.9 g/dL (calc) (ref 1.9–3.7)
Glucose, Bld: 109 mg/dL — ABNORMAL HIGH (ref 65–99)
Potassium: 4 mmol/L (ref 3.5–5.3)
Sodium: 139 mmol/L (ref 135–146)
Total Bilirubin: 0.4 mg/dL (ref 0.2–1.2)
Total Protein: 7.1 g/dL (ref 6.1–8.1)
eGFR: 115 mL/min/{1.73_m2} (ref >=60)

## 2022-01-03 LAB — LIPID PANEL
Cholesterol: 257 mg/dL — ABNORMAL HIGH (ref <200)
HDL: 60 mg/dL (ref >=50)
LDL Cholesterol (Calc): 164 mg/dL (calc) — ABNORMAL HIGH
Non-HDL Cholesterol (Calc): 197 mg/dL (calc) — ABNORMAL HIGH (ref <130)
Total CHOL/HDL Ratio: 4.3 (calc) (ref <5.0)
Triglycerides: 179 mg/dL — ABNORMAL HIGH (ref <150)

## 2022-01-03 LAB — TSH: TSH: 1.63 mIU/L

## 2022-06-24 ENCOUNTER — Encounter: Payer: BC Managed Care – PPO | Admitting: Nurse Practitioner

## 2022-06-26 ENCOUNTER — Encounter: Payer: BC Managed Care – PPO | Admitting: Nurse Practitioner

## 2022-07-03 DIAGNOSIS — M79644 Pain in right finger(s): Secondary | ICD-10-CM | POA: Diagnosis not present

## 2022-07-03 DIAGNOSIS — M0609 Rheumatoid arthritis without rheumatoid factor, multiple sites: Secondary | ICD-10-CM | POA: Diagnosis not present

## 2022-07-11 DIAGNOSIS — R5383 Other fatigue: Secondary | ICD-10-CM | POA: Diagnosis not present

## 2022-07-11 DIAGNOSIS — M0609 Rheumatoid arthritis without rheumatoid factor, multiple sites: Secondary | ICD-10-CM | POA: Diagnosis not present

## 2022-07-18 IMAGING — CR DG CHEST 2V
2 series · 2 of 2 positions shown · non-contrast
Comparison: None.

CLINICAL DATA: Episodes of chest and epigastric pain for 1 month.
Cough.

EXAM:
CHEST - 2 VIEW

[w chest pa]
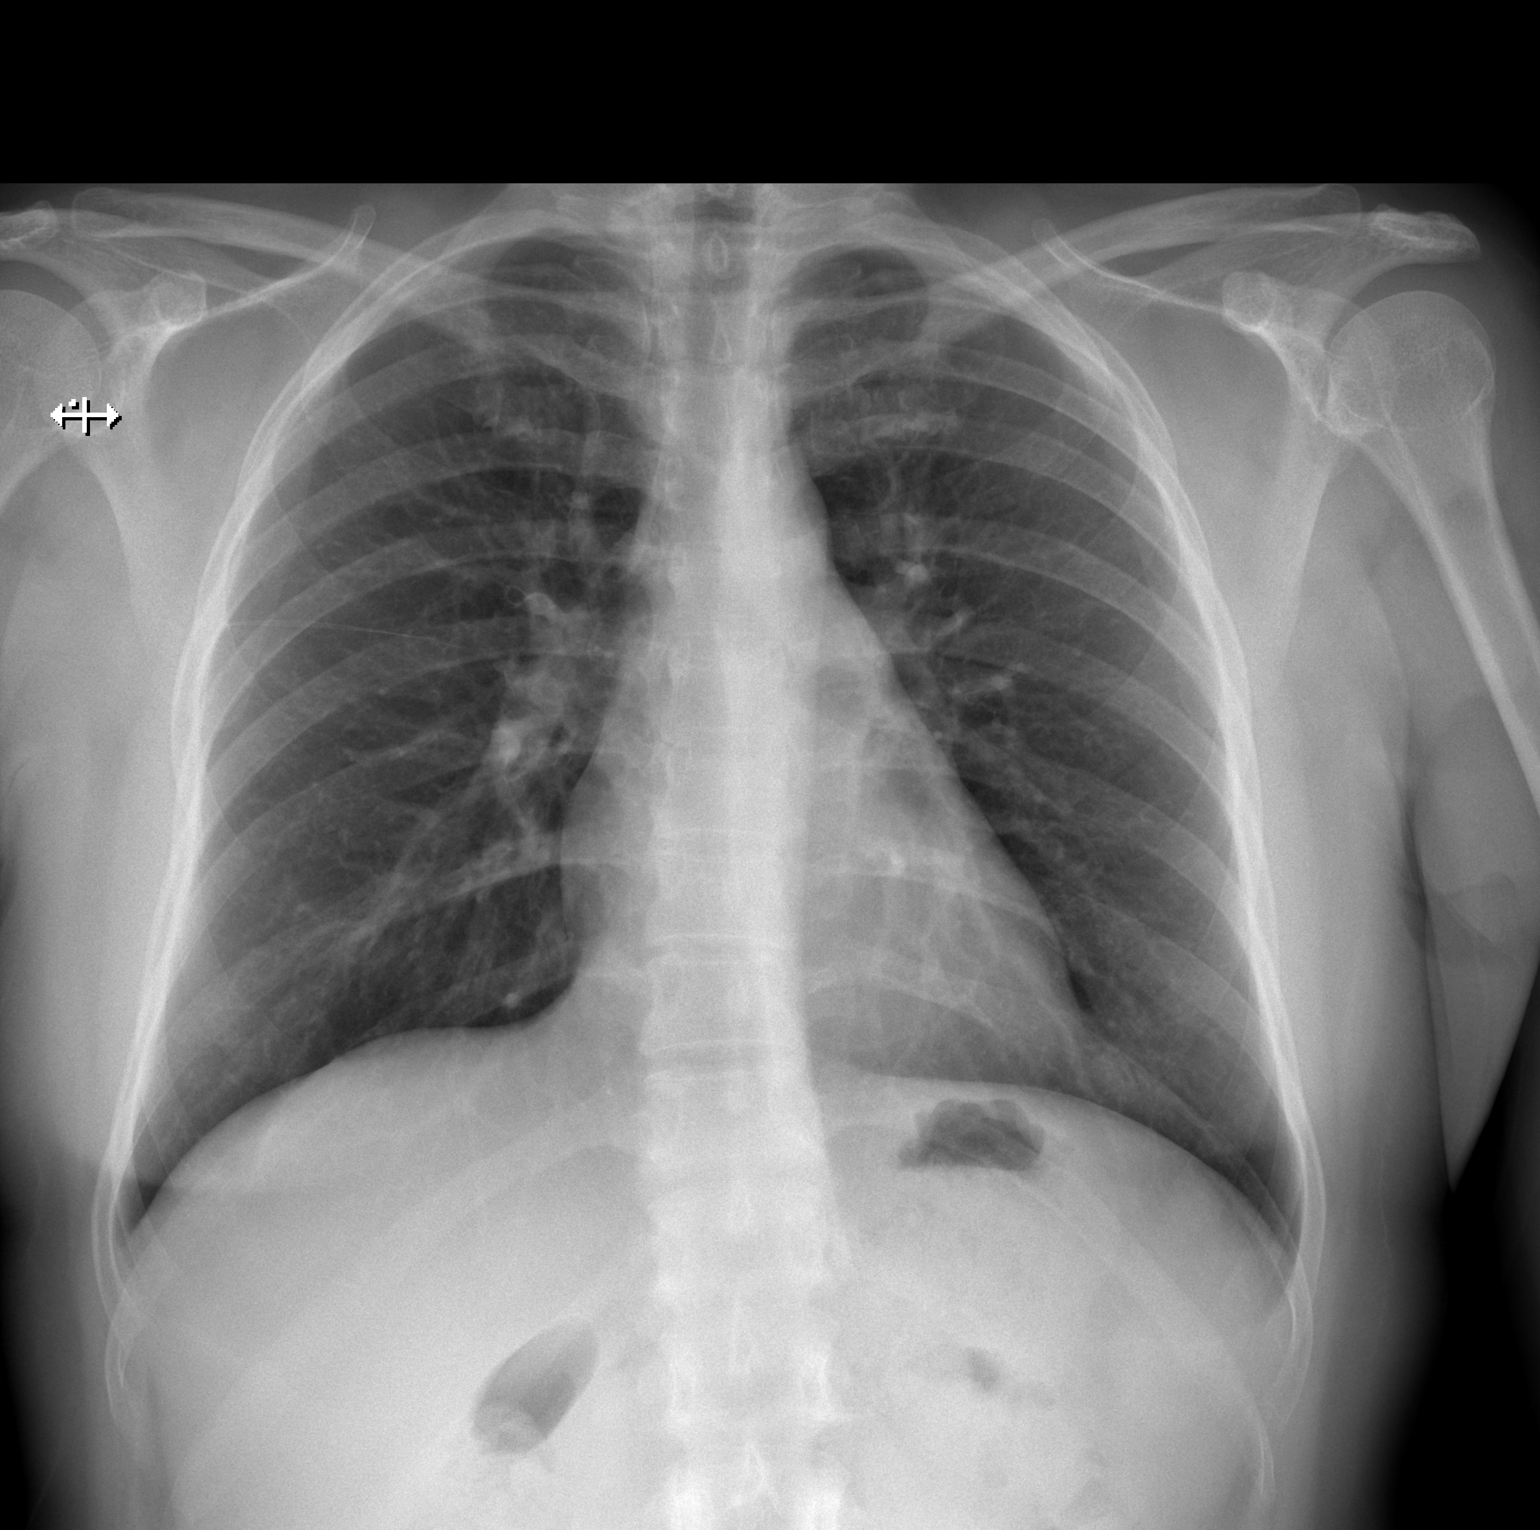

[w chest lat]
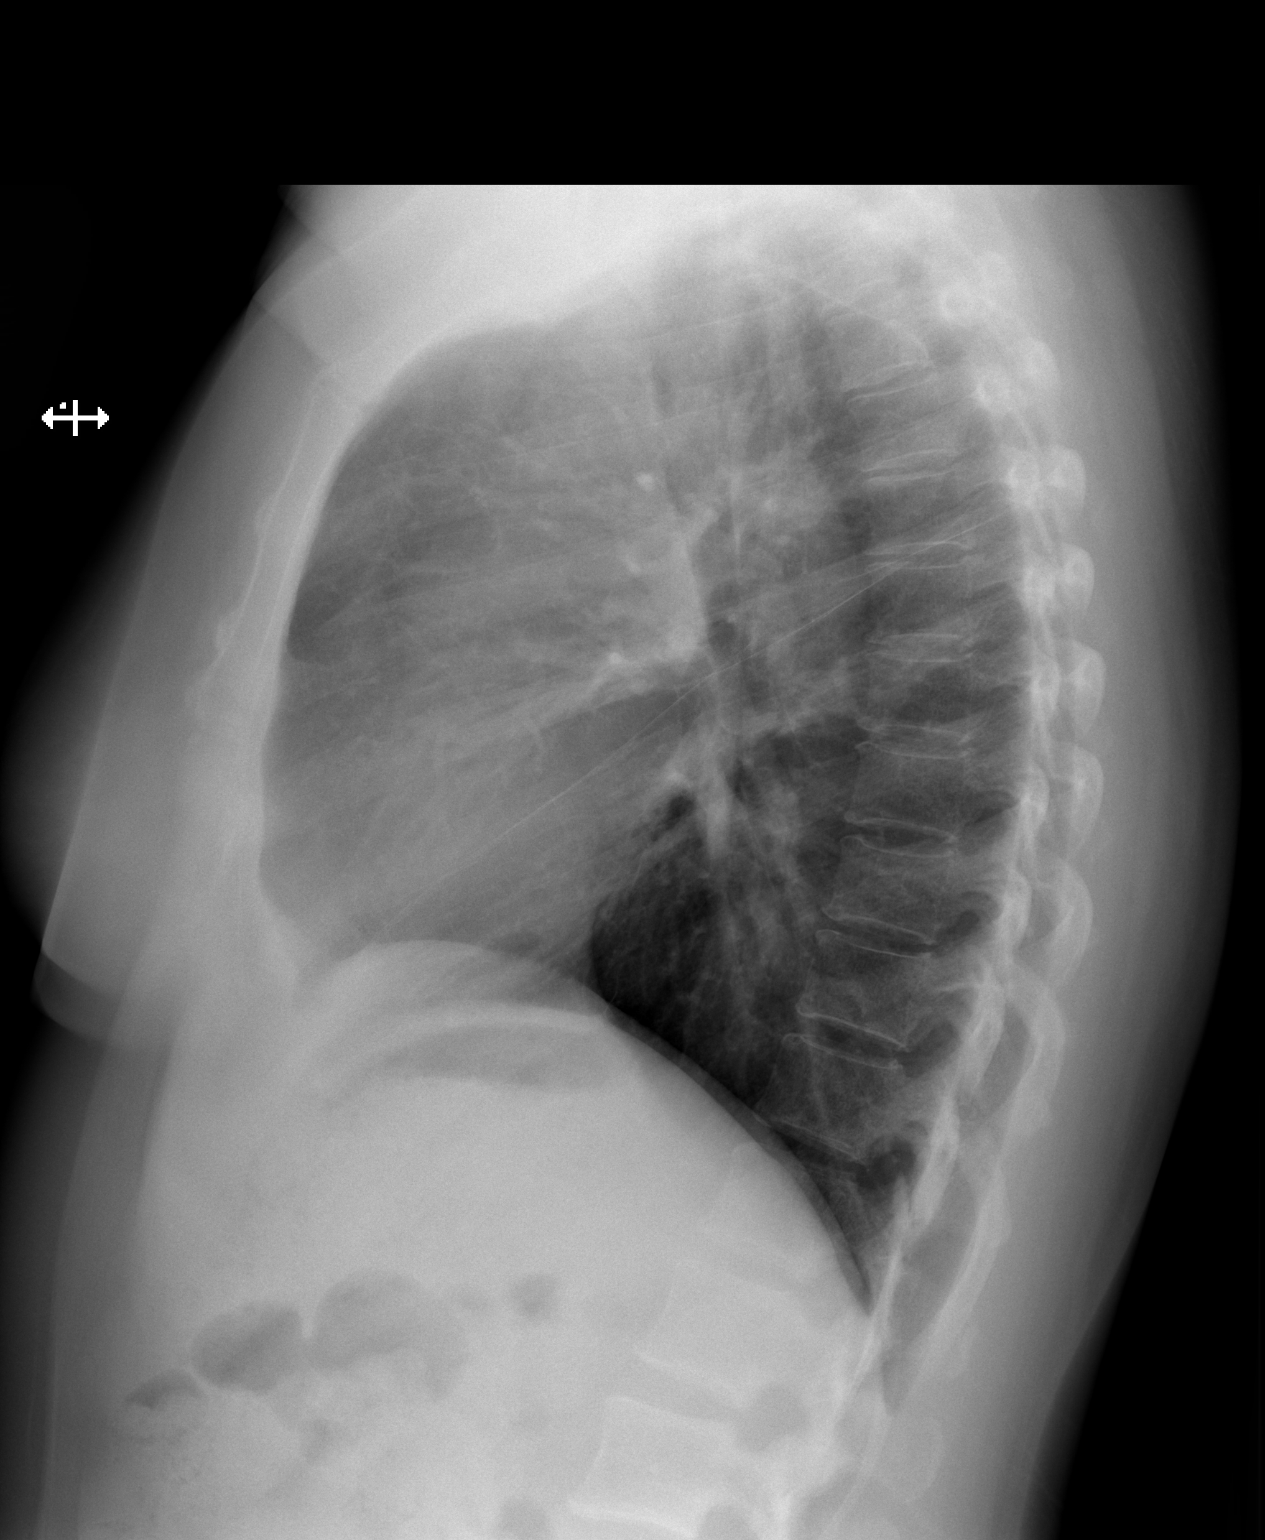

[2 of 2 positions shown; findings below may reference images not displayed]

FINDINGS: The cardiomediastinal contours are normal. The lungs are clear.
Pulmonary vasculature is normal. No consolidation, pleural effusion,
or pneumothorax. No acute osseous abnormalities are seen. Normal
bowel gas pattern in the upper abdomen.
IMPRESSION: Negative radiographs of the chest.

## 2022-09-24 NOTE — Progress Notes (Unsigned)
Complete Physical  Assessment and Plan:  Diagnoses and all orders for this visit:  Encounter for Annual Physical Exam with abnormal findings Due annually  Health Maintenance reviewed Healthy lifestyle reviewed and goals set Mammogram last one 11/2020- will call to schedule  Screening for colon cancer Refer to GI  Menorrhagia with regular cycle Has not been following with gyn  Obesity - BMI 30 Long discussion about weight loss, diet, and exercise Recommended diet heavy in fruits and veggies and low in animal meats, cheeses, and dairy products, appropriate calorie intake Discussed appropriate weight for height, goal weight this year <135lb Follow up at next visit  Hyperlipidemia Have tried to have patient initiate Rosuvastatin but has not started- reluctant to start medications, wants to do diet and exercise Continue low cholesterol diet and exercise.  - lipid panel  - TSH   Seronegative RA (HCC)/bil thumb pain Follows with [facility]  rheumatology Currently on Humira 0.4 ml SQ q 2 weeks  Iron deficiency anemia On supplement, recheck, menorrhagia, likely needs until menopause - CBC, iron/TIBC/ferritin  Abnormal Glucose Discussed disease and risks Discussed diet/exercise, weight management  -     Hemoglobin A1c  Screening for hematuria or proteinuria -     Urinalysis routine with reflex microscopic - Microalbumin/creatinine urine ratio  Screening for thyroid disorder -     TSH  Screening for ischemic heart disease - EKG  Vitamin D deficiency Continue Vit D supplementation to maintain value in therapeutic level of 60-100  -     VITAMIN D 25 Hydroxy (Vit-D Deficiency, Fractures)  Medication management -     CBC with Differential/Platelet -     COMPLETE METABOLIC PANEL WITH GFR -     Magnesium  -     UA routine with reflex microscopic  Elevated LFT;s Limit Tylenol and alcohol, last recheck was in normal range Continue diet and exercise - CMP  Colon cancer  screening    Referred to GI                                                              Screening for cervical cancer - Pap taken and submitted  Discussed med's effects and SE's. Screening labs and tests as requested with regular follow-up as recommended. Over 40 minutes of exam, counseling, chart review, and complex, high level critical decision making was performed this visit.   Future Appointments  Date Time Provider Department Center  09/28/2023  2:00 PM Raynelle Dick, NP [facility] None    HPI  47 y.o. female presents for a complete physical. She has Menorrhagia; Tinea versicolor; Overweight (BMI 25.0-29.9); Chronic thumb pain, bilateral; Other abnormal glucose (prediabetes); Hyperlipidemia; Vitamin D deficiency; Migraine without aura and without status migrainosus, not intractable; Seronegative rheumatoid arthritis; Iron deficiency anemia; Costochondral chest pain; and Elevated LFTs on their problem list.   She immigrated from [place], now is a citizen, married with 2 children. 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 [name] is Pharmacist, community. No grandkids yet. 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  No concerns.  She is no longer following with GYN Dr. Audie Box for PAP neg HPV 05/2018, follows annually.  Getting mammograms nnually at [facility] center[facility], last 11/2020- needs to schedule  She is following with Azucena Fallen  at [facility] and diagnosed with seronegative RA, no longer on methotrexate, newly on adalimumab. Follows with [facility]  She reports seasonal allergies, typically during fall and winter for which she takes OTC medication PRN. Will only take as needed.   Hx of migraines, L sided, without aura but with photosensitivity, this is rare, only had 1 last year, have improved recently and typically well controlled by PRN tylenol.   She has had rash (white spots on hands/forearms, ankles) suspect for tinea versicolor;  limited benefit with selsun blue, now prescribed ketoconale and sees benefit with this.   BMI is Body mass index is 30.76 kg/m., she has been working on diet Do more yard work  No weight goal, just wants to lose weight.  Has not been working closely on diet Wt Readings from Last 3 Encounters:  09/25/22 147 lb 3.2 oz (66.8 kg)  01/02/22 148 lb (67.1 kg)  08/08/21 145 lb 3.2 oz (65.9 kg)   Today their BP is BP: 104/70  BP Readings from Last 3 Encounters:  09/25/22 104/70  01/02/22 102/62  06/21/21 120/78  She does not workout. Denies CP, dyspnea, dizziness.     She is not on cholesterol medication and denies myalgias. Her cholesterol is not at goal. Very reluctant to start medication. The cholesterol last visit was:   Lab Results  Component Value Date   CHOL 257 (H) 01/02/2022   HDL 60 01/02/2022   LDLCALC 164 (H) 01/02/2022   TRIG 179 (H) 01/02/2022   CHOLHDL 4.3 01/02/2022    She has been working on diet and exercise for glucose management (hx of prediabetes, A1C 5.8% in 2019). Last A1C in the office was:  Lab Results  Component Value Date   HGBA1C 5.6 06/21/2021   Patient is on Vitamin D supplement, 5000 IU daily    Lab Results  Component Value Date   VD25OH 41 06/21/2021     Hx of menorrhagia, intermittent iron def, was recommended iron supplement last visit, taking 65 mg slow release iron daily  Periods lasting about 1 week, first 4 days changes a pad q hour then 3 days less. Lab Results  Component Value Date   IRON 78 06/21/2021   TIBC 354 06/21/2021   FERRITIN 37 06/21/2021    Current Medications:  Current Outpatient Medications on File Prior to Visit  Medication Sig Dispense Refill   Adalimumab (HUMIRA PEN [place]) Inject into the skin. Every 2 weeks     ferrous sulfate 325 (65 FE) MG tablet Take 1 tab every other day with vitamin C rich foods to help prevent anemia. 30 tablet 0   VITAMIN D, CHOLECALCIFEROL, PO Take 5,000 Units by mouth daily.     ketoconazole  (NIZORAL) 2 % cream Apply cream to rash once daily for 2-4 weeks. (Patient not taking: Reported on 09/25/2022) 60 g 0   No current facility-administered medications on file prior to visit.   Allergies:  No Known Allergies Medical History:  She has Menorrhagia; Tinea versicolor; Overweight (BMI 25.0-29.9); Chronic thumb pain, bilateral; Other abnormal glucose (prediabetes); Hyperlipidemia; Vitamin D deficiency; Migraine without aura and without status migrainosus, not intractable; Seronegative rheumatoid arthritis; Iron deficiency anemia; Costochondral chest pain; and Elevated LFTs on their problem list. Health Maintenance:   Immunization History  Administered Date(s) Administered   Influenza,inj,Quad PF,6+ Mos 06/21/2021   Influenza-Unspecified 03/01/2020   PPD Test 04/22/2018   Td 04/22/2018      Last Dental Exam: Last 2020 - encouraged to schedule Last  Eye Exam: Last 2021, glasses   Patient Care Team: Lucky Cowboy, MD as PCP - General (Internal Medicine)  Surgical History:  She has a past surgical history that includes Cesarean section and Tubal ligation (Bilateral). Family History:  Herfamily history includes Anemia in her mother; Diabetes in her father, paternal grandfather, and paternal grandmother; Heart disease in her father; Kidney Stones in her father; Migraines in her mother; Obesity in her father. Social History:  She reports that she has never smoked. She has never used smokeless tobacco. She reports that she does not drink alcohol and does not use drugs.  Review of Systems: Review of Systems  Constitutional:  Negative for chills, fever, malaise/fatigue and weight loss.  HENT:  Negative for congestion, hearing loss, nosebleeds, sinus pain, sore throat and tinnitus.        Allergies  Eyes:  Negative for blurred vision and double vision.  Respiratory:  Positive for cough (persistsent dry cough, only at work). Negative for hemoptysis, sputum production, shortness of  breath and wheezing.   Cardiovascular:  Negative for chest pain, palpitations, orthopnea, claudication and leg swelling.  Gastrointestinal:  Negative for abdominal pain, blood in stool, constipation, diarrhea, heartburn, melena, nausea and vomiting.  Genitourinary: Negative.   Musculoskeletal:  Positive for joint pain (R thumb, L knee). Negative for back pain, myalgias and neck pain.  Skin:  Negative for rash (intermittent over her neck).  Neurological:  Negative for dizziness, tingling, sensory change, weakness and headaches.  Endo/Heme/Allergies:  Negative for environmental allergies and polydipsia.  Psychiatric/Behavioral:  Negative for depression, memory loss, substance abuse and suicidal ideas. The patient is not nervous/anxious and does not have insomnia.   All other systems reviewed and are negative.   Physical Exam: Estimated body mass index is 30.76 kg/m as calculated from the following:   Height as of this encounter:  (1.473 m).   Weight as of this encounter: 147 lb 3.2 oz (66.8 kg). BP 104/70   Pulse 87   Temp 97.7 F (36.5 C)   Ht  (1.473 m)   Wt 147 lb 3.2 oz (66.8 kg)   LMP 09/08/2022   SpO2 98%   BMI 30.76 kg/m  General Appearance: Well nourished, in no apparent distress.  Eyes: PERRLA, EOMs, conjunctiva no swelling or erythema ENT/Mouth: Ext aud canals clear, normal light reflex with TMs without erythema, bulging. Good dentition. No erythema, swelling, or exudate on post pharynx.  Hearing normal.  Neck: Supple, thyroid normal. No bruits  Respiratory: Respiratory effort normal, BS present/clear throughout without rales, rhonchi, wheezing or stridor.  Cardio: RRR without murmurs, rubs or gallops. Brisk peripheral pulses without edema.  Chest: symmetric, with normal excursions and percussion.  Breasts: breasts appear normal, no suspicious masses, no skin or nipple changes or axillary nodes.  Abdomen: Soft, nontender (indicates epigastric area as location of  episodic pain), no guarding, rebound, hernias, masses, or organomegaly.  Lymphatics: Non tender without lymphadenopathy.  Pelvic exam: normal external genitalia, vulva, vagina, cervix, uterus and adnexa, PAP: Pap smear done today, thin-prep method.  Musculoskeletal: Full ROM all peripheral extremities, bilateral knees with crepitus, no effusion, heat, laxity, 5/5 strength throughout, and normal gait.  Skin: Warm, dry without lesions, Ecchymosis. She has faint patchy/spots lightening of skin without raised lesions or discharge to bil hands/forearms, ankles, improved Neuro: Cranial nerves intact, reflexes equal bilaterally. Normal muscle tone, no cerebellar symptoms. Sensation intact.  Psych: Awake and oriented X 3, normal affect, Insight and Judgment appropriate.   EKG: NSR,  no ST changes  Reford Olliff E  2:20 PM [facility]

## 2022-09-25 ENCOUNTER — Encounter: Payer: Self-pay | Admitting: Nurse Practitioner

## 2022-09-25 ENCOUNTER — Ambulatory Visit (INDEPENDENT_AMBULATORY_CARE_PROVIDER_SITE_OTHER): Payer: BC Managed Care – PPO | Admitting: Nurse Practitioner

## 2022-09-25 VITALS — BP 104/70 | HR 87 | Temp 97.7°F | Ht <= 58 in | Wt 147.2 lb

## 2022-09-25 DIAGNOSIS — E559 Vitamin D deficiency, unspecified: Secondary | ICD-10-CM

## 2022-09-25 DIAGNOSIS — I10 Essential (primary) hypertension: Secondary | ICD-10-CM

## 2022-09-25 DIAGNOSIS — Z79899 Other long term (current) drug therapy: Secondary | ICD-10-CM

## 2022-09-25 DIAGNOSIS — M0600 Rheumatoid arthritis without rheumatoid factor, unspecified site: Secondary | ICD-10-CM

## 2022-09-25 DIAGNOSIS — Z136 Encounter for screening for cardiovascular disorders: Secondary | ICD-10-CM

## 2022-09-25 DIAGNOSIS — D509 Iron deficiency anemia, unspecified: Secondary | ICD-10-CM

## 2022-09-25 DIAGNOSIS — Z0000 Encounter for general adult medical examination without abnormal findings: Secondary | ICD-10-CM

## 2022-09-25 DIAGNOSIS — Z124 Encounter for screening for malignant neoplasm of cervix: Secondary | ICD-10-CM | POA: Diagnosis not present

## 2022-09-25 DIAGNOSIS — Z130 Encounter for screening for diseases of the blood and blood-forming organs and certain disorders involving the immune mechanism: Secondary | ICD-10-CM | POA: Diagnosis not present

## 2022-09-25 DIAGNOSIS — E663 Overweight: Secondary | ICD-10-CM

## 2022-09-25 DIAGNOSIS — Z1389 Encounter for screening for other disorder: Secondary | ICD-10-CM | POA: Diagnosis not present

## 2022-09-25 DIAGNOSIS — Z1211 Encounter for screening for malignant neoplasm of colon: Secondary | ICD-10-CM

## 2022-09-25 DIAGNOSIS — Z131 Encounter for screening for diabetes mellitus: Secondary | ICD-10-CM | POA: Diagnosis not present

## 2022-09-25 DIAGNOSIS — Z0001 Encounter for general adult medical examination with abnormal findings: Secondary | ICD-10-CM

## 2022-09-25 DIAGNOSIS — E785 Hyperlipidemia, unspecified: Secondary | ICD-10-CM

## 2022-09-25 DIAGNOSIS — J302 Other seasonal allergic rhinitis: Secondary | ICD-10-CM

## 2022-09-25 DIAGNOSIS — R7309 Other abnormal glucose: Secondary | ICD-10-CM

## 2022-09-25 DIAGNOSIS — Z1329 Encounter for screening for other suspected endocrine disorder: Secondary | ICD-10-CM

## 2022-09-25 DIAGNOSIS — Z13220 Encounter for screening for lipoid disorders: Secondary | ICD-10-CM

## 2022-09-25 DIAGNOSIS — R7989 Other specified abnormal findings of blood chemistry: Secondary | ICD-10-CM

## 2022-09-25 MED ORDER — FLUTICASONE PROPIONATE 50 MCG/ACT NA SUSP: 2.0000 | Freq: Every day | NASAL | 1 refills | Status: DC

## 2022-09-25 NOTE — Patient Instructions (Signed)
Schedule mammogram at  [facility] [address] [place] [address] [phone]  [facility] will be calling you to schedule appt to discuss colonoscopy for colon cancer screening    GENERAL HEALTH GOALS   Know what a healthy weight is for you (roughly BMI <25) and aim to maintain this   Aim for 7+ servings of fruits and vegetables daily   70-80+ fluid ounces of water or unsweet tea for healthy kidneys   Limit to max 1 drink of alcohol per day; avoid smoking/tobacco   Limit animal fats in diet for cholesterol and heart health - choose grass fed whenever available   Avoid highly processed foods, and foods high in saturated/trans fats   Aim for low stress - take time to unwind and care for your mental health   Aim for 150 min of moderate intensity exercise weekly for heart health, and weights twice weekly for bone health   Aim for 7-9 hours of sleep daily

## 2022-09-26 LAB — COMPLETE METABOLIC PANEL WITH GFR
AG Ratio: 1.3 (calc) (ref 1.0–2.5)
ALT: 11 U/L (ref 6–29)
AST: 14 U/L (ref 10–35)
Albumin: 3.9 g/dL (ref 3.6–5.1)
Alkaline phosphatase (APISO): 50 U/L (ref 31–125)
BUN: 13 mg/dL (ref 7–25)
CO2: 25 mmol/L (ref 20–32)
Calcium: 9.2 mg/dL (ref 8.6–10.2)
Chloride: 106 mmol/L (ref 98–110)
Creat: 0.54 mg/dL (ref 0.50–0.99)
Globulin: 3 g/dL (calc) (ref 1.9–3.7)
Glucose, Bld: 103 mg/dL — ABNORMAL HIGH (ref 65–99)
Potassium: 4.4 mmol/L (ref 3.5–5.3)
Sodium: 139 mmol/L (ref 135–146)
Total Bilirubin: 0.3 mg/dL (ref 0.2–1.2)
Total Protein: 6.9 g/dL (ref 6.1–8.1)
eGFR: 116 mL/min/{1.73_m2} (ref >=60)

## 2022-09-26 LAB — CBC WITH DIFFERENTIAL/PLATELET
Absolute Monocytes: 541 cells/uL (ref 200–950)
Basophils Absolute: 59 cells/uL (ref 0–200)
Basophils Relative: 0.9 %
Eosinophils Absolute: 119 cells/uL (ref 15–500)
Eosinophils Relative: 1.8 %
HCT: 34.4 % — ABNORMAL LOW (ref 35.0–45.0)
Hemoglobin: 10.9 g/dL — ABNORMAL LOW (ref 11.7–15.5)
Lymphs Abs: 2211 cells/uL (ref 850–3900)
MCH: 25 pg — ABNORMAL LOW (ref 27.0–33.0)
MCHC: 31.7 g/dL — ABNORMAL LOW (ref 32.0–36.0)
MCV: 78.9 fL — ABNORMAL LOW (ref 80.0–100.0)
MPV: 11 fL (ref 7.5–12.5)
Monocytes Relative: 8.2 %
Neutro Abs: 3670 cells/uL (ref 1500–7800)
Neutrophils Relative %: 55.6 %
Platelets: 294 10*3/uL (ref 140–400)
RBC: 4.36 10*6/uL (ref 3.80–5.10)
RDW: 13.6 % (ref 11.0–15.0)
Total Lymphocyte: 33.5 %
WBC: 6.6 10*3/uL (ref 3.8–10.8)

## 2022-09-26 LAB — URINALYSIS, ROUTINE W REFLEX MICROSCOPIC
Bacteria, UA: NONE SEEN /HPF
Bilirubin Urine: NEGATIVE
Glucose, UA: NEGATIVE
Hyaline Cast: NONE SEEN /LPF
Ketones, ur: NEGATIVE
Leukocytes,Ua: NEGATIVE
Nitrite: NEGATIVE
Protein, ur: NEGATIVE
RBC / HPF: NONE SEEN /HPF (ref 0–2)
Specific Gravity, Urine: 1.027 (ref 1.001–1.035)
WBC, UA: NONE SEEN /HPF (ref 0–5)
pH: 6.5 (ref 5.0–8.0)

## 2022-09-26 LAB — IRON, TOTAL/TOTAL IRON BINDING CAP
%SAT: 11 % (calc) — ABNORMAL LOW (ref 16–45)
Iron: 51 ug/dL (ref 40–190)
TIBC: 447 mcg/dL (calc) (ref 250–450)

## 2022-09-26 LAB — LIPID PANEL
Cholesterol: 227 mg/dL — ABNORMAL HIGH (ref <200)
HDL: 62 mg/dL (ref >=50)
LDL Cholesterol (Calc): 132 mg/dL (calc) — ABNORMAL HIGH
Non-HDL Cholesterol (Calc): 165 mg/dL (calc) — ABNORMAL HIGH (ref <130)
Total CHOL/HDL Ratio: 3.7 (calc) (ref <5.0)
Triglycerides: 190 mg/dL — ABNORMAL HIGH (ref <150)

## 2022-09-26 LAB — MICROALBUMIN / CREATININE URINE RATIO
Creatinine, Urine: 171 mg/dL (ref 20–275)
Microalb Creat Ratio: 8 mg/g creat (ref <30)
Microalb, Ur: 1.3 mg/dL

## 2022-09-26 LAB — HEMOGLOBIN A1C
Hgb A1c MFr Bld: 6 % of total Hgb — ABNORMAL HIGH (ref <5.7)
Mean Plasma Glucose: 126 mg/dL
eAG (mmol/L): 7 mmol/L

## 2022-09-26 LAB — FERRITIN: Ferritin: 8 ng/mL — ABNORMAL LOW (ref 16–232)

## 2022-09-26 LAB — MAGNESIUM: Magnesium: 2.1 mg/dL (ref 1.5–2.5)

## 2022-09-26 LAB — VITAMIN D 25 HYDROXY (VIT D DEFICIENCY, FRACTURES): Vit D, 25-Hydroxy: 23 ng/mL — ABNORMAL LOW (ref 30–100)

## 2022-09-26 LAB — TSH: TSH: 1.41 mIU/L

## 2022-09-28 LAB — PAP, TP IMAGING W/ HPV RNA, RFLX HPV TYPE 16,18/45: HPV DNA High Risk: NOT DETECTED

## 2022-09-28 LAB — PAP, TP IMAGING, WNL RFLX HPV

## 2023-01-01 DIAGNOSIS — M0609 Rheumatoid arthritis without rheumatoid factor, multiple sites: Secondary | ICD-10-CM | POA: Diagnosis not present

## 2023-01-01 DIAGNOSIS — M722 Plantar fascial fibromatosis: Secondary | ICD-10-CM | POA: Diagnosis not present

## 2023-01-01 DIAGNOSIS — R200 Anesthesia of skin: Secondary | ICD-10-CM | POA: Diagnosis not present

## 2023-01-01 DIAGNOSIS — M79644 Pain in right finger(s): Secondary | ICD-10-CM | POA: Diagnosis not present

## 2023-02-13 IMAGING — MG MM DIGITAL SCREENING BILAT W/ TOMO AND CAD
8 series · 9 of 24 positions shown · non-contrast
Comparison: Previous exam(s).

CLINICAL DATA: Screening.

EXAM:
DIGITAL SCREENING BILATERAL MAMMOGRAM WITH TOMOSYNTHESIS AND CAD
TECHNIQUE: Bilateral screening digital craniocaudal and mediolateral oblique
mammograms were obtained. Bilateral screening digital breast
tomosynthesis was performed. The images were evaluated with
computer-aided detection.

[R MLO synth-2D]
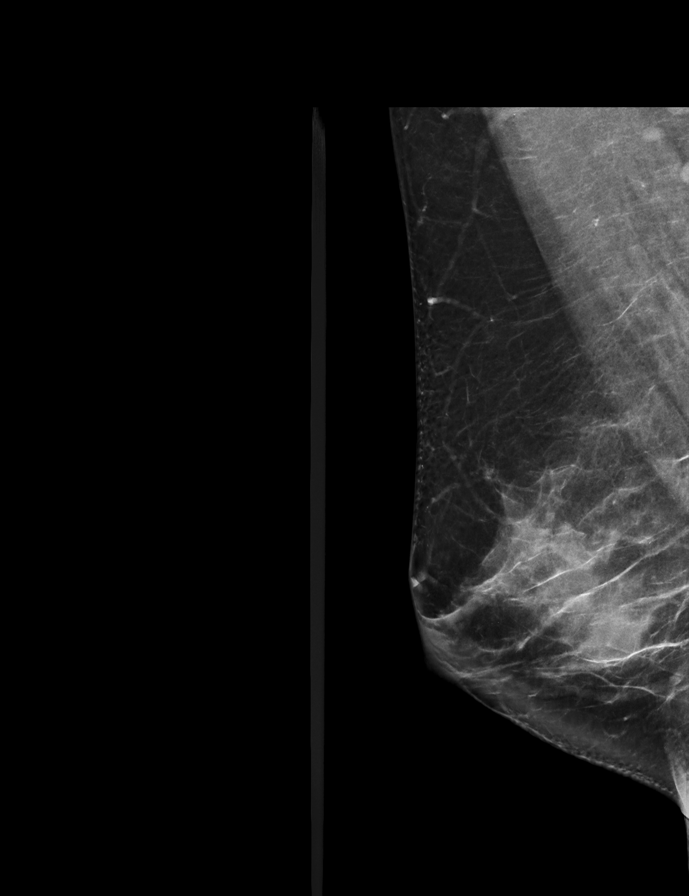

[L MLO synth-2D]
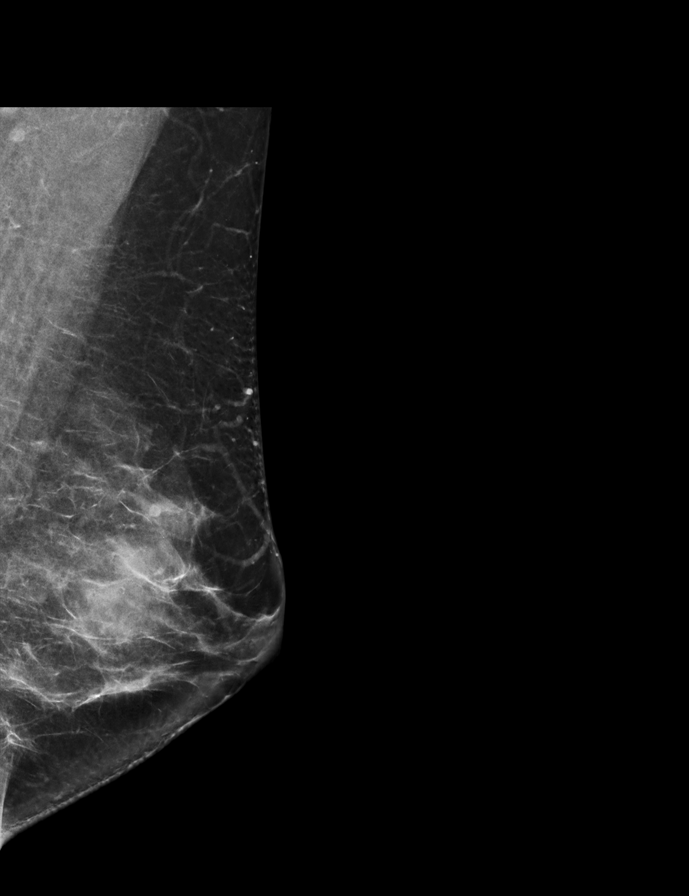

[L CC synth-2D]
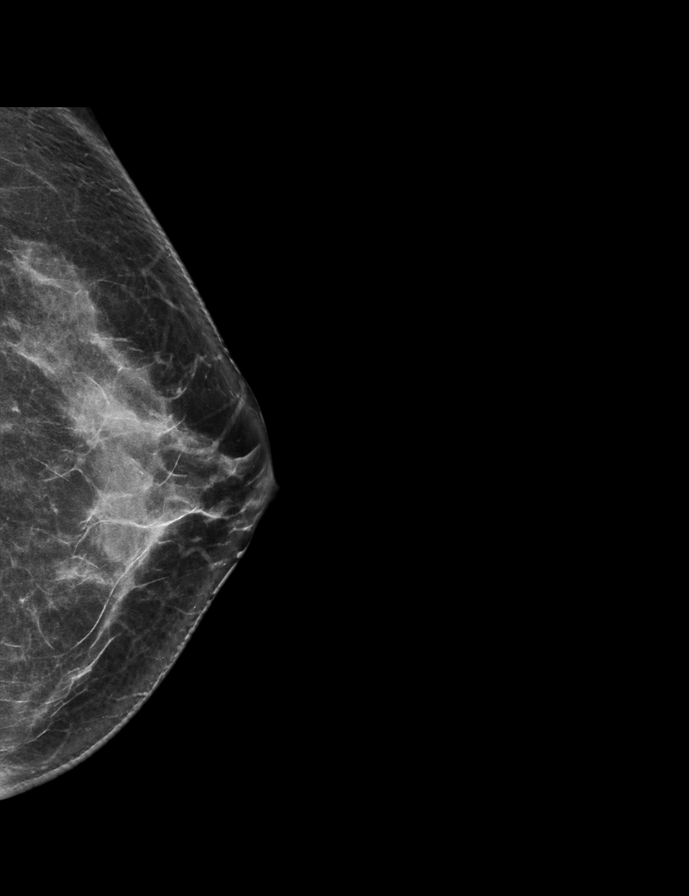

[R CC synth-2D]
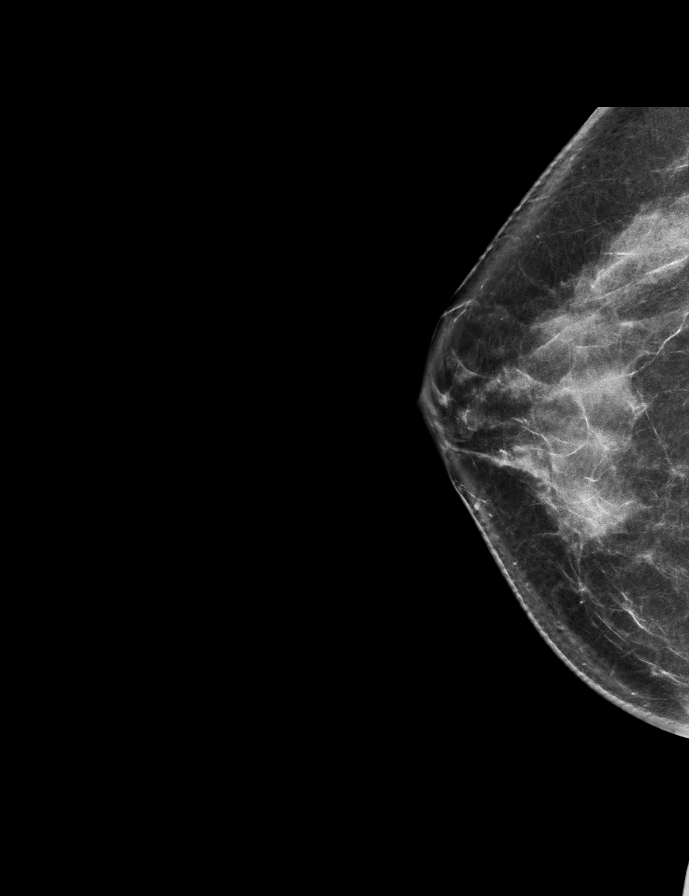

[L MLO tomo · 2 of 72 frames shown]
[frame 24/72]
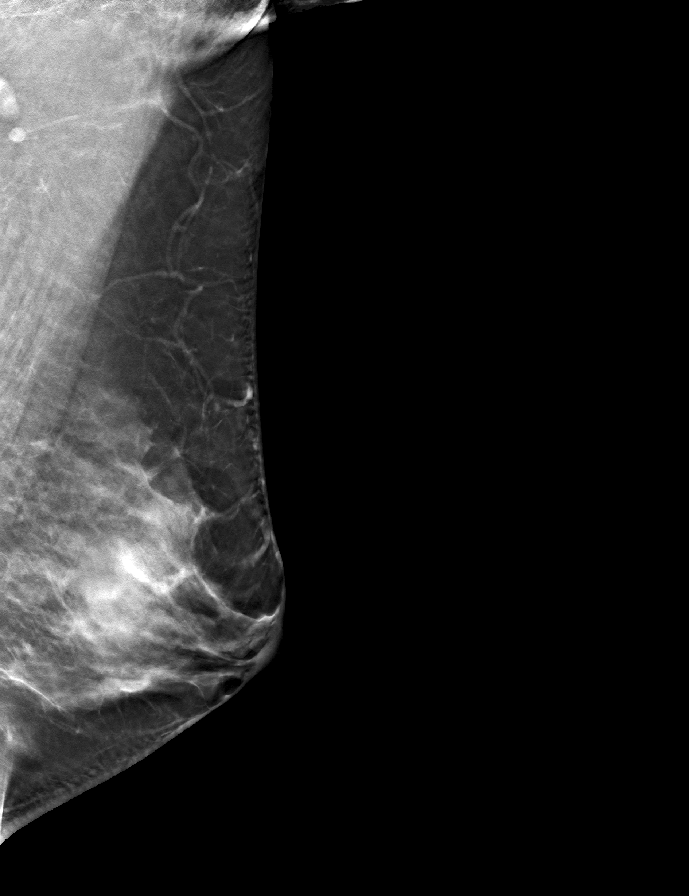
[frame 37/72]
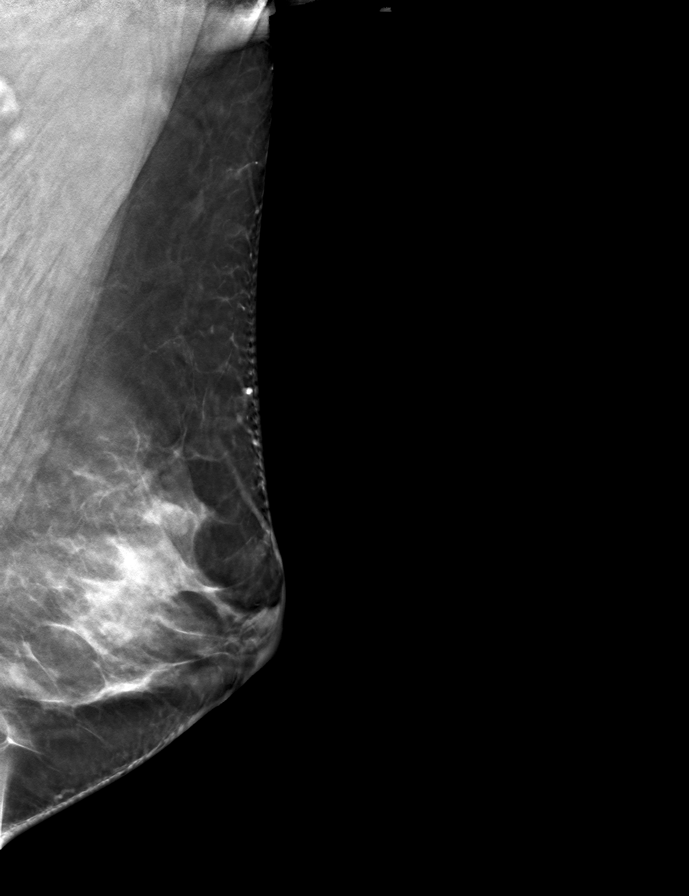

[L CC tomo · tomo slice 35/68.0]
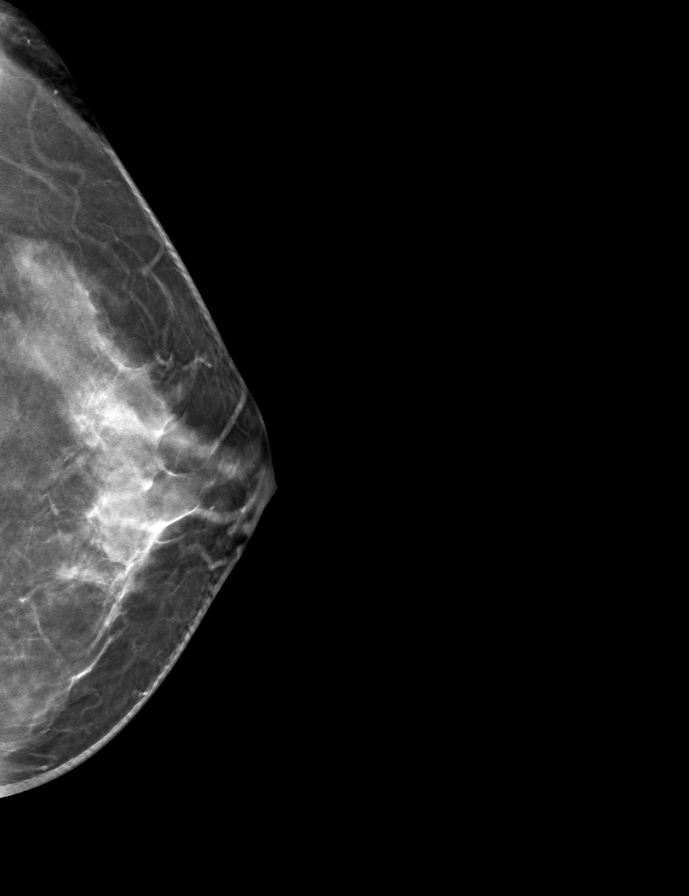

[R CC tomo · tomo slice 36/71.0]
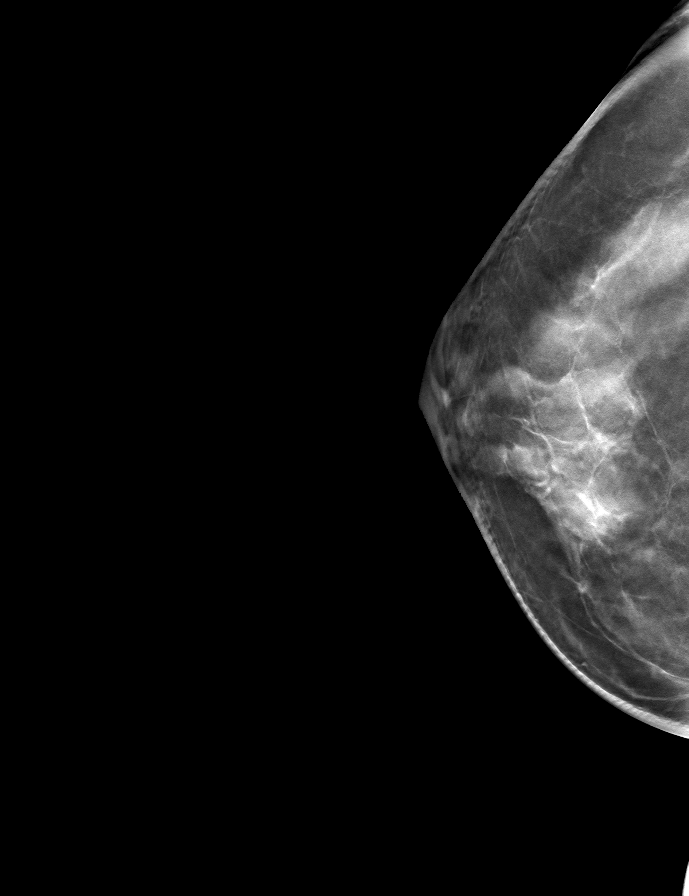

[R MLO tomo · tomo slice 39/76.0]
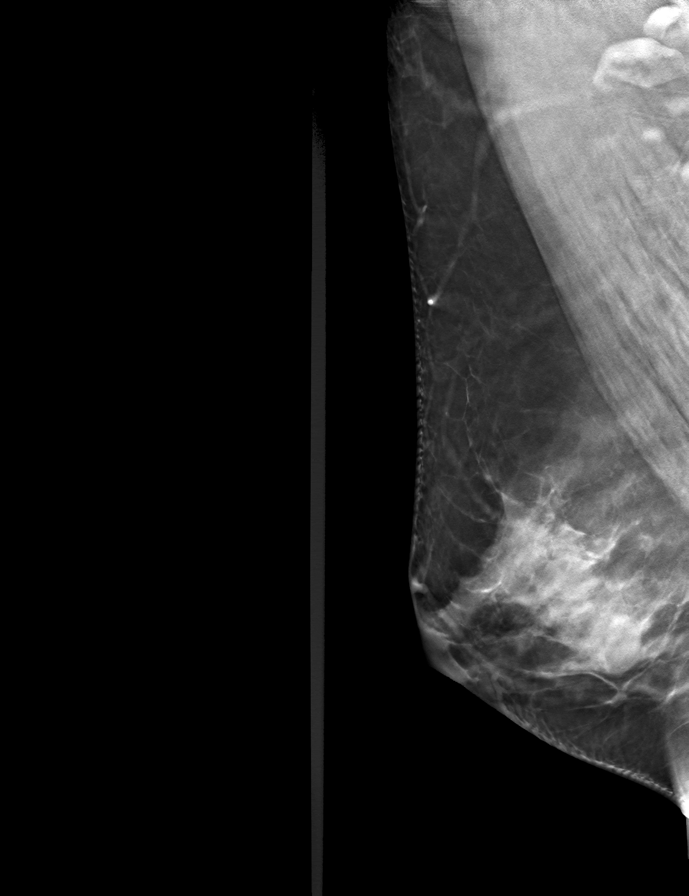

[9 of 24 positions shown; findings below may reference images not displayed]

ACR Breast Density Category c: The breast tissue is heterogeneously
dense, which may obscure small masses.
FINDINGS: There are no findings suspicious for malignancy.
IMPRESSION: No mammographic evidence of malignancy. A result letter of this
screening mammogram will be mailed directly to the patient.

RECOMMENDATION:
Screening mammogram in one year. (Code:Q3-W-BC3)

BI-RADS CATEGORY  1: Negative.

## 2023-03-31 NOTE — Progress Notes (Unsigned)
6 MONTH FOLLOW UP  Assessment and Plan:  Diagnoses and all orders for this visit:   Obesity - BMI 30 Long discussion about weight loss, diet, and exercise Recommended diet heavy in fruits and veggies and low in animal meats, cheeses, and dairy products, appropriate calorie intake Discussed appropriate weight for height, goal weight this year <135lb Follow up at next visit - TSH  Cholesterol Initiate medication if LDL persists 130+ Continue low cholesterol diet and exercise.  If LDL remains elevated will suggest low dose statin - lipid panel    Seronegative RA (HCC)/bil thumb pain Follows with Azucena Fallen, rheumatology Currently on adalimumab  Iron deficiency anemia On supplement, recheck, menorrhagia, likely needs until menopause - CBC - Iron/TIBC/Ferritin panel  Abnormal glucose (hx of prediabetes) Discussed disease and risks Discussed diet/exercise, weight management  -     A1c    Medication management -     CBC with Differential/Platelet -     COMPLETE METABOLIC PANEL WITH GFR -     Lipid panel -     Magnesium -     TSH -     Iron, TIBC and Ferritin Panel -     Hemoglobin A1C w/out eAG   Flu vaccine need -     Fluzone Trivalent Flu Vaccine ([place]uli dose preparattion)           Discussed med's effects and SE's. Screening labs and tests as requested with regular follow-up as recommended. Over 40 minutes of exam, counseling, chart review, and complex, high level critical decision making was performed this visit.   Future Appointments  Date Time Provider Department Center  10/01/2023  2:00 PM Raynelle Dick, NP [facility] None    HPI  47 y.o. female presents for a complete physical. She has Menorrhagia; Tinea versicolor; Overweight (BMI 25.0-29.9); Chronic thumb pain, bilateral; Other abnormal glucose (prediabetes); Hyperlipidemia; Vitamin D deficiency; Migraine without aura and without status migrainosus, not intractable; Seronegative rheumatoid  arthritis (HCC); Iron deficiency anemia; Costochondral chest pain; and Elevated LFTs on their problem list.   She immigrated from [place], now is a citizen, married with 2 children. 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 No grandkids yet. 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  She is following with Azucena Fallen at [facility] and diagnosed with seronegative RA,currently on adalimumab. Follows with ortho Dr. Urban Gibson as needed as well.   She reports seasonal allergies, typically during fall and winter for which she takes OTC medication PRN.   Hx of migraines, L sided, without aura but with photosensitivity, this is rare, only had 1 last year, have improved recently and typically well controlled by PRN tylenol.    BMI is Body mass index is 30.43 kg/m., she has been working on diet She does a lot of yard work.  She is trying to limit white bread, pasta.  Is still eating rice but trying to limit.  She has not been drinking as much water. She is down 3 pounds from last year Wt Readings from Last 3 Encounters:  04/02/23 145 lb 9.6 oz (66 kg)  09/25/22 147 lb 3.2 oz (66.8 kg)  01/02/22 148 lb (67.1 kg)   BP well controlled without medication. Today their BP is BP: 102/68  BP Readings from Last 3 Encounters:  04/02/23 102/68  09/25/22 104/70  01/02/22 102/62  She does not workout. Denies CP, dyspnea, dizziness.    She is not on cholesterol medication and  denies myalgias. Her cholesterol is not at goal. The cholesterol last visit was:   Lab Results  Component Value Date   CHOL 227 (H) 09/25/2022   HDL 62 09/25/2022   LDLCALC 132 (H) 09/25/2022   TRIG 190 (H) 09/25/2022   CHOLHDL 3.7 09/25/2022    She has been working on diet and exercise for glucose management (hx of prediabetes, A1C 5.8% in 2019). Last A1C in the office was:  Lab Results  Component Value Date   HGBA1C 6.0 (H) 09/25/2022   Patient is on Vitamin D  supplement, ? 5000 IU daily    Lab Results  Component Value Date   VD25OH 23 (L) 09/25/2022     Hx of menorrhagia, intermittent iron def, was recommended iron supplement last visit, taking 65 mg slow release iron daily .  She is having a period every 20 days and lasts 7 days - 3 days are heavy. She does have issues with cramping and takes midol.  Lab Results  Component Value Date   IRON 51 09/25/2022   TIBC 447 09/25/2022   FERRITIN 8 (L) 09/25/2022    Current Medications:  Current Outpatient Medications on File Prior to Visit  Medication Sig Dispense Refill  . Adalimumab (HUMIRA PEN [place]) Inject into the skin. Every 2 weeks    . etodolac (LODINE) 400 MG tablet 1 tablet with food Orally Twice a day with food as needed for 30 day(s)    . ferrous sulfate 325 (65 FE) MG tablet Take 1 tab every other day with vitamin C rich foods to help prevent anemia. 30 tablet 0  . VITAMIN D, CHOLECALCIFEROL, PO Take 5,000 Units by mouth daily.    . fluticasone (FLONASE) 50 MCG/ACT nasal spray Place 2 sprays into both nostrils at bedtime. (Patient not taking: Reported on 04/02/2023) 16 g 1   No current facility-administered medications on file prior to visit.   Allergies:  No Known Allergies Medical History:  She has Menorrhagia; Tinea versicolor; Overweight (BMI 25.0-29.9); Chronic thumb pain, bilateral; Other abnormal glucose (prediabetes); Hyperlipidemia; Vitamin D deficiency; Migraine without aura and without status migrainosus, not intractable; Seronegative rheumatoid arthritis (HCC); Iron deficiency anemia; Costochondral chest pain; and Elevated LFTs on their problem list. Health Maintenance:   Immunization History  Administered Date(s) Administered  . Influenza,inj,Quad PF,6+ Mos 02/03/2019, 06/21/2021  . Influenza-Unspecified 03/01/2020  . PPD Test 04/22/2018  . Td 04/22/2018   Health Maintenance  Topic Date Due  . COVID-19 Vaccine (1) Never done  . MAMMOGRAM  12/27/2021  . Colonoscopy   Never done  . INFLUENZA VACCINE  01/01/2023  . Cervical Cancer Screening (HPV/Pap Cotest)  09/25/2027  . DTaP/Tdap/Td (2 - Tdap) 04/22/2028  . HPV VACCINES  Aged Out  . Hepatitis C Screening  Discontinued  . HIV Screening  Discontinued     Patient Care Team: Lucky Cowboy, MD as PCP - General (Internal Medicine)  Surgical History:  She has a past surgical history that includes Cesarean section and Tubal ligation (Bilateral). Family History:  Herfamily history includes Anemia in her mother; Diabetes in her father, paternal grandfather, and paternal grandmother; Heart disease in her father; Kidney Stones in her father; Migraines in her mother; Obesity in her father. Social History:  She reports that she has never smoked. She has never used smokeless tobacco. She reports that she does not drink alcohol and does not use drugs.  Review of Systems: Review of Systems  Constitutional:  Negative for chills, fever and weight loss.  HENT:  Negative for congestion and hearing loss.   Eyes:  Negative for blurred vision and double vision.  Respiratory:  Negative for cough and shortness of breath.   Cardiovascular:  Negative for chest pain, palpitations, orthopnea and leg swelling.  Gastrointestinal:  Negative for abdominal pain, constipation, diarrhea, heartburn, nausea and vomiting.  Musculoskeletal:  Negative for falls, joint pain and myalgias.  Skin:  Negative for rash.  Neurological:  Negative for dizziness, tingling, tremors, loss of consciousness and headaches.  Psychiatric/Behavioral:  Negative for depression, memory loss and suicidal ideas.     Physical Exam: Estimated body mass index is 30.43 kg/m as calculated from the following:   Height as of this encounter: 4\' 10"  (1.473 m).   Weight as of this encounter: 145 lb 9.6 oz (66 kg). BP 102/68   Pulse 91   Temp 97.7 F (36.5 C)   Ht 4\' 10"  (1.473 m)   Wt 145 lb 9.6 oz (66 kg)   SpO2 95%   BMI 30.43 kg/m  General  Appearance: Well nourished, in no apparent distress.  Eyes: PERRLA, EOMs, conjunctiva no swelling or erythema ENT/Mouth: Ext aud canals clear, normal light reflex with TMs without erythema, bulging. Good dentition. No erythema, swelling, or exudate on post pharynx. Tonsils not swollen or erythematous. Hearing normal.  Neck: Supple, thyroid normal. No bruits  Respiratory: Respiratory effort normal, BS present/clear throughout without rales, rhonchi, wheezing or stridor.  Cardio: RRR without murmurs, rubs or gallops. Brisk peripheral pulses without edema.  Chest: symmetric, with normal excursions and percussion.  Abdomen: Soft, non tender no guarding, rebound, hernias, masses, or organomegaly.  Lymphatics: Non tender without lymphadenopathy.  Musculoskeletal: Full ROM all peripheral extremities, 5/5 strength throughout, and normal gait. Skin: Warm, dry without lesions, Ecchymosis.  Neuro: Cranial nerves intact.  Normal muscle tone, no cerebellar symptoms. Sensation intact.  Psych: Awake and oriented X 3, normal affect, Insight and Judgment appropriate.    Endi Lagman Hollie Salk 2:33 PM [facility]

## 2023-04-02 ENCOUNTER — Ambulatory Visit (INDEPENDENT_AMBULATORY_CARE_PROVIDER_SITE_OTHER): Payer: BC Managed Care – PPO | Admitting: Nurse Practitioner

## 2023-04-02 ENCOUNTER — Encounter: Payer: Self-pay | Admitting: Nurse Practitioner

## 2023-04-02 VITALS — BP 102/68 | HR 91 | Temp 97.7°F | Ht <= 58 in | Wt 145.6 lb

## 2023-04-02 DIAGNOSIS — R7309 Other abnormal glucose: Secondary | ICD-10-CM | POA: Diagnosis not present

## 2023-04-02 DIAGNOSIS — E559 Vitamin D deficiency, unspecified: Secondary | ICD-10-CM

## 2023-04-02 DIAGNOSIS — E663 Overweight: Secondary | ICD-10-CM

## 2023-04-02 DIAGNOSIS — Z23 Encounter for immunization: Secondary | ICD-10-CM | POA: Diagnosis not present

## 2023-04-02 DIAGNOSIS — E785 Hyperlipidemia, unspecified: Secondary | ICD-10-CM | POA: Diagnosis not present

## 2023-04-02 DIAGNOSIS — M0600 Rheumatoid arthritis without rheumatoid factor, unspecified site: Secondary | ICD-10-CM | POA: Diagnosis not present

## 2023-04-02 DIAGNOSIS — D509 Iron deficiency anemia, unspecified: Secondary | ICD-10-CM

## 2023-04-02 DIAGNOSIS — Z79899 Other long term (current) drug therapy: Secondary | ICD-10-CM | POA: Diagnosis not present

## 2023-04-02 NOTE — Patient Instructions (Signed)

## 2023-04-03 LAB — COMPLETE METABOLIC PANEL WITH GFR
AG Ratio: 1.3 (calc) (ref 1.0–2.5)
ALT: 11 U/L (ref 6–29)
AST: 13 U/L (ref 10–35)
Albumin: 3.9 g/dL (ref 3.6–5.1)
Alkaline phosphatase (APISO): 53 U/L (ref 31–125)
BUN: 11 mg/dL (ref 7–25)
CO2: 29 mmol/L (ref 20–32)
Calcium: 8.9 mg/dL (ref 8.6–10.2)
Chloride: 106 mmol/L (ref 98–110)
Creat: 0.65 mg/dL (ref 0.50–0.99)
Globulin: 3 g/dL (ref 1.9–3.7)
Glucose, Bld: 112 mg/dL — ABNORMAL HIGH (ref 65–99)
Potassium: 4.3 mmol/L (ref 3.5–5.3)
Sodium: 140 mmol/L (ref 135–146)
Total Bilirubin: 0.3 mg/dL (ref 0.2–1.2)
Total Protein: 6.9 g/dL (ref 6.1–8.1)
eGFR: 110 mL/min/{1.73_m2} (ref >=60)

## 2023-04-03 LAB — CBC WITH DIFFERENTIAL/PLATELET
Absolute Lymphocytes: 2213 {cells}/uL (ref 850–3900)
Absolute Monocytes: 453 {cells}/uL (ref 200–950)
Basophils Absolute: 62 {cells}/uL (ref 0–200)
Basophils Relative: 1 %
Eosinophils Absolute: 161 {cells}/uL (ref 15–500)
Eosinophils Relative: 2.6 %
HCT: 35.3 % (ref 35.0–45.0)
Hemoglobin: 11 g/dL — ABNORMAL LOW (ref 11.7–15.5)
MCH: 25.8 pg — ABNORMAL LOW (ref 27.0–33.0)
MCHC: 31.2 g/dL — ABNORMAL LOW (ref 32.0–36.0)
MCV: 82.7 fL (ref 80.0–100.0)
MPV: 11 fL (ref 7.5–12.5)
Monocytes Relative: 7.3 %
Neutro Abs: 3311 {cells}/uL (ref 1500–7800)
Neutrophils Relative %: 53.4 %
Platelets: 287 10*3/uL (ref 140–400)
RBC: 4.27 10*6/uL (ref 3.80–5.10)
RDW: 15.6 % — ABNORMAL HIGH (ref 11.0–15.0)
Total Lymphocyte: 35.7 %
WBC: 6.2 10*3/uL (ref 3.8–10.8)

## 2023-04-03 LAB — LIPID PANEL
Cholesterol: 229 mg/dL — ABNORMAL HIGH (ref <200)
HDL: 55 mg/dL (ref >=50)
LDL Cholesterol (Calc): 133 mg/dL — ABNORMAL HIGH
Non-HDL Cholesterol (Calc): 174 mg/dL — ABNORMAL HIGH (ref <130)
Total CHOL/HDL Ratio: 4.2 (calc) (ref <5.0)
Triglycerides: 264 mg/dL — ABNORMAL HIGH (ref <150)

## 2023-04-03 LAB — TSH: TSH: 1.63 m[IU]/L

## 2023-04-03 LAB — IRON,TIBC AND FERRITIN PANEL
%SAT: 13 % — ABNORMAL LOW (ref 16–45)
Ferritin: 16 ng/mL (ref 16–232)
Iron: 52 ug/dL (ref 40–190)
TIBC: 405 ug/dL (ref 250–450)

## 2023-04-03 LAB — HEMOGLOBIN A1C W/OUT EAG: Hgb A1c MFr Bld: 5.7 %{Hb} — ABNORMAL HIGH (ref <5.7)

## 2023-04-03 LAB — MAGNESIUM: Magnesium: 2.1 mg/dL (ref 1.5–2.5)

## 2023-04-14 ENCOUNTER — Telehealth: Payer: Self-pay | Admitting: Nurse Practitioner

## 2023-04-14 ENCOUNTER — Other Ambulatory Visit: Payer: Self-pay | Admitting: Internal Medicine

## 2023-04-14 DIAGNOSIS — Z1231 Encounter for screening mammogram for malignant neoplasm of breast: Secondary | ICD-10-CM

## 2023-04-14 NOTE — Telephone Encounter (Signed)
Husband called wanting clarification on issues that was discussed at patient's last appointment. Did you want her to have a mammogram or pap smear?

## 2023-04-14 NOTE — Telephone Encounter (Signed)
She is due for mammogram, can call the breast center and schedule on her own

## 2023-05-14 ENCOUNTER — Ambulatory Visit
Admission: RE | Admit: 2023-05-14 | Discharge: 2023-05-14 | Disposition: A | Discharge status: Home / Self Care | Payer: BC Managed Care – PPO | Source: Ambulatory Visit | Attending: Internal Medicine | Admitting: Internal Medicine

## 2023-05-14 DIAGNOSIS — Z1231 Encounter for screening mammogram for malignant neoplasm of breast: Secondary | ICD-10-CM

## 2023-05-18 ENCOUNTER — Other Ambulatory Visit: Payer: Self-pay | Admitting: Internal Medicine

## 2023-05-18 DIAGNOSIS — R928 Other abnormal and inconclusive findings on diagnostic imaging of breast: Secondary | ICD-10-CM

## 2023-06-25 ENCOUNTER — Ambulatory Visit
Admission: RE | Admit: 2023-06-25 | Discharge: 2023-06-25 | Disposition: A | Discharge status: Home / Self Care | Payer: BC Managed Care – PPO | Source: Ambulatory Visit | Attending: Internal Medicine | Admitting: Internal Medicine

## 2023-06-25 DIAGNOSIS — R928 Other abnormal and inconclusive findings on diagnostic imaging of breast: Secondary | ICD-10-CM

## 2023-09-28 ENCOUNTER — Encounter: Payer: BC Managed Care – PPO | Admitting: Nurse Practitioner

## 2023-10-01 ENCOUNTER — Encounter: Payer: BC Managed Care – PPO | Admitting: Nurse Practitioner

## 2024-03-24 ENCOUNTER — Ambulatory Visit (INDEPENDENT_AMBULATORY_CARE_PROVIDER_SITE_OTHER): Payer: Self-pay | Admitting: Family

## 2024-03-24 VITALS — BP 128/74 | HR 77 | Temp 97.9°F | Resp 18 | Ht <= 58 in | Wt 146.8 lb

## 2024-03-24 DIAGNOSIS — D509 Iron deficiency anemia, unspecified: Secondary | ICD-10-CM

## 2024-03-24 DIAGNOSIS — Z1159 Encounter for screening for other viral diseases: Secondary | ICD-10-CM

## 2024-03-24 DIAGNOSIS — M0600 Rheumatoid arthritis without rheumatoid factor, unspecified site: Secondary | ICD-10-CM

## 2024-03-24 DIAGNOSIS — E782 Mixed hyperlipidemia: Secondary | ICD-10-CM | POA: Diagnosis not present

## 2024-03-24 DIAGNOSIS — Z23 Encounter for immunization: Secondary | ICD-10-CM

## 2024-03-24 DIAGNOSIS — J302 Other seasonal allergic rhinitis: Secondary | ICD-10-CM

## 2024-03-24 DIAGNOSIS — Z7689 Persons encountering health services in other specified circumstances: Secondary | ICD-10-CM

## 2024-03-24 DIAGNOSIS — Z113 Encounter for screening for infections with a predominantly sexual mode of transmission: Secondary | ICD-10-CM

## 2024-03-24 DIAGNOSIS — R7303 Prediabetes: Secondary | ICD-10-CM

## 2024-03-24 DIAGNOSIS — Z1211 Encounter for screening for malignant neoplasm of colon: Secondary | ICD-10-CM

## 2024-03-24 DIAGNOSIS — E559 Vitamin D deficiency, unspecified: Secondary | ICD-10-CM

## 2024-03-24 DIAGNOSIS — N921 Excessive and frequent menstruation with irregular cycle: Secondary | ICD-10-CM

## 2024-03-24 MED ORDER — FLUTICASONE PROPIONATE 50 MCG/ACT NA SUSP: 2.0000 | Freq: Every day | NASAL | 1 refills | Status: AC

## 2024-03-24 MED ORDER — LORATADINE 10 MG PO TABS: 10.0000 mg | ORAL_TABLET | Freq: Every day | ORAL | 11 refills | Status: AC

## 2024-03-24 NOTE — Progress Notes (Signed)
 Provider: Roxan Plough FNP-C   Sherri Anthony, Sherri BROCKS, NP  Patient Care Team: Glorya Bartley, Sherri BROCKS, NP as PCP - General (Family Medicine)  Extended Emergency Contact Information Primary Emergency Contact: Deshmukh,JOSEPH Address: [address]          [place], [place] [address] [place] Phone: [phone] Relation: Spouse  Code Status:  Full Code  Goals of care: Advanced Directive information    03/24/2024   10:14 AM  Advanced Directives  Does Patient Have a Medical Advance Directive? No  Would patient like information on creating a medical advance directive? No - Patient declined     Chief Complaint  Patient presents with   Establish Care    New patient appointment.      Discussed the use of AI scribe software for clinical note transcription with the patient, who gave verbal consent to proceed.  History of Present Illness   Sherri Anthony is a 48 year old female who presents for establishment of care.  She has a history of high cholesterol with previous levels recorded at 229 mg/dL, 772 mg/dL, and 742 mg/dL.  Rheumatoid arthritis affects her hands, and she is under the care of a rheumatologist. She is currently on Humira for management.  She has iron deficiency anemia, experiencing occasional fatigue and dizziness. Her hemoglobin has improved from 10.9 to 11.0, and she takes prescribed iron supplements.  She experiences heavy menstrual periods and was previously on birth control pills to manage this, but stopped due to continued irregular bleeding. She reports that an ultrasound was performed and she was told she did not have fibroids.  She has a history of vitamin D  deficiency and takes vitamin D  supplements. Her levels were checked in June and reported as good.  She was noted to be prediabetic with an A1c of 5.7, down from 6.0 a year ago. She exercises at home and at the gym three times a week, focusing on cardiovascular and strength  training.  She experiences seasonal allergies, sometimes using Claritin, but has not used Flonase  despite it being listed in her medication list.  No current issues with migraines, high blood pressure, or thyroid  problems. She denies alcohol use, smoking, or drug use.    Past Medical History:  Diagnosis Date   BMI 28.0-28.9,adult    H. pylori infection 06/01/2020   History of prediabetes 04/23/2018   Migraines    Seasonal allergies    Past Surgical History:  Procedure Laterality Date   CESAREAN SECTION     x 2, 2002, 1999   TUBAL LIGATION Bilateral    2002    No Known Allergies  Allergies as of 03/24/2024   No Known Allergies      Medication List        Accurate as of March 24, 2024 11:13 AM. If you have any questions, ask your nurse or doctor.          STOP taking these medications    etodolac 400 MG tablet Commonly known as: LODINE Stopped by: Raynee Mccasland C Liyana Suniga       TAKE these medications    ferrous sulfate  325 (65 FE) MG tablet Take 1 tab every other day with vitamin C rich foods to help prevent anemia.   fluticasone  50 MCG/ACT nasal spray Commonly known as: FLONASE  Place 2 sprays into both nostrils at bedtime.   HUMIRA PEN Uncertain Inject into the skin. Every 2 weeks   loratadine 10 MG tablet Commonly known as: CLARITIN  Take 1 tablet (10 mg total) by mouth daily. Started by: Rigel Filsinger C Timothy Townsel   VITAMIN D  (CHOLECALCIFEROL) PO Take 5,000 Units by mouth daily.        Review of Systems  Constitutional:  Negative for appetite change, chills, fatigue, fever and unexpected weight change.  HENT:  Negative for congestion, dental problem, ear discharge, ear pain, facial swelling, hearing loss, nosebleeds, postnasal drip, rhinorrhea, sinus pressure, sinus pain, sneezing, sore throat, tinnitus and trouble swallowing.   Eyes:  Negative for pain, discharge, redness, itching and visual disturbance.  Respiratory:  Negative for cough, chest tightness,  shortness of breath and wheezing.   Cardiovascular:  Negative for chest pain, palpitations and leg swelling.  Gastrointestinal:  Negative for abdominal distention, abdominal pain, blood in stool, constipation, diarrhea, nausea and vomiting.  Endocrine: Negative for cold intolerance, heat intolerance, polydipsia, polyphagia and polyuria.  Genitourinary:  Negative for difficulty urinating, dysuria, flank pain, frequency and urgency.  Musculoskeletal:  Positive for arthralgias. Negative for back pain, gait problem, joint swelling, myalgias, neck pain and neck stiffness.       Right hand   Skin:  Negative for color change, pallor, rash and wound.  Neurological:  Negative for dizziness, syncope, speech difficulty, weakness, light-headedness, numbness and headaches.  Hematological:  Does not bruise/bleed easily.  Psychiatric/Behavioral:  Negative for agitation, behavioral problems, confusion, hallucinations, self-injury, sleep disturbance and suicidal ideas. The patient is not nervous/anxious.     Immunization History  Administered Date(s) Administered   Fluzone  Influenza virus vaccine,trivalent (IIV3), split virus 04/02/2023   Influenza, Seasonal, Injecte, Preservative Fre 03/24/2024   Influenza,inj,Quad PF,6+ Mos 02/03/2019, 06/21/2021   Influenza-Unspecified 03/01/2020   PPD Test 04/22/2018   Td 04/22/2018   Pertinent  Health Maintenance Due  Topic Date Due   Colonoscopy  Never done   Mammogram  06/24/2024   Influenza Vaccine  Completed      06/19/2020   12:42 PM 03/24/2024   10:13 AM  Fall Risk  Falls in the past year?  1  Was there an injury with Fall?  0  Fall Risk Category Calculator  1  (RETIRED) Patient Fall Risk Level Low fall risk    Patient at Risk for Falls Due to  No Fall Risks  Fall risk Follow up  Falls evaluation completed     Data saved with a previous flowsheet row definition   Functional Status Survey:    Vitals:   03/24/24 1017  BP: 128/74  Pulse: 77   Resp: 18  Temp: 97.9 F (36.6 C)  SpO2: 97%  Weight: 146 lb 12.8 oz (66.6 kg)  Height: 4' 10 (1.473 m)   Body mass index is 30.68 kg/m. Physical Exam GENERAL: Alert, cooperative, well developed, no acute distress. HEENT: Normocephalic, normal oropharynx, moist mucous membranes, ears normal bilaterally, nasal congestion present, no sinus tenderness. NECK: Neck normal. CHEST: Clear to auscultation bilaterally, no wheezes, rhonchi, or crackles. CARDIOVASCULAR: Normal heart rate and rhythm, S1 and S2 normal without murmurs. ABDOMEN: Soft, non-tender, non-distended, without organomegaly, normal bowel sounds. EXTREMITIES: No cyanosis or edema, no numbness or tingling in legs, no pain in legs. NEUROLOGICAL: Cranial nerves grossly intact, moves all extremities without gross motor or sensory deficit, sensation intact in feet.  SKIN: No rash,no lesion or erythema   PSYCHIATRY/BEHAVIORAL: Mood stable    Labs reviewed: Recent Labs    04/02/23 1501  NA 140  K 4.3  CL 106  CO2 29  GLUCOSE 112*  BUN 11  CREATININE 0.65  CALCIUM  8.9  MG 2.1   Recent Labs    04/02/23 1501  AST 13  ALT 11  BILITOT 0.3  PROT 6.9   Recent Labs    04/02/23 1501  WBC 6.2  NEUTROABS 3,311  HGB 11.0*  HCT 35.3  MCV 82.7  PLT 287   Lab Results  Component Value Date   TSH 1.63 04/02/2023   Lab Results  Component Value Date   HGBA1C 5.7 (H) 04/02/2023   Lab Results  Component Value Date   CHOL 229 (H) 04/02/2023   HDL 55 04/02/2023   LDLCALC 133 (H) 04/02/2023   TRIG 264 (H) 04/02/2023   CHOLHDL 4.2 04/02/2023    Significant Diagnostic Results in last 30 days:  No results found.  Assessment/Plan    Iron deficiency anemia secondary to heavy menstrual bleeding Anemia has improved with a hemoglobin level of 11 g/dL, up from 89.0 g/dL. Heavy menstrual bleeding persists despite previous use of birth control pills, which she has discontinued. No fibroids detected on ultrasound. -  Discuss the potential impact of heavy menstrual bleeding on anemia.  Hyperlipidemia Hyperlipidemia with total cholesterol previously at 229 mg/dL, triglycerides at 735 mg/dL, and LDL at 866 mg/dL. Dietary modifications have been discussed to manage cholesterol levels. - Order fasting lipid panel. - Advise dietary modifications including reducing intake of saturated fats, increasing plant-based proteins, and avoiding high-cholesterol foods like cheese and egg yolks.  Prediabetes Prediabetes with a previous A1c of 5.7%, improved from a prior A1c of 6.0%. Lifestyle modifications including diet and exercise have been implemented. - Order fasting blood glucose and A1c. - Continue lifestyle modifications including regular exercise and dietary changes to reduce carbohydrate intake.  Rheumatoid arthritis of hand Rheumatoid arthritis affecting the hands, currently managed with Humira. - continue to follow up with Rheumatologist   Vitamin D  deficiency Recent lab work indicated improvement, and current supplementation is ongoing. - Continue vitamin D  supplementation.  Seasonal allergic rhinitis Seasonal allergic rhinitis with symptoms of nasal congestion. No current use of Flonase  or Claritin, but these were recommended for symptom management. - Recommend over-the-counter Claritin for allergy symptoms. - Advise use of Flonase  nasal spray for nasal congestion.   Health Maintenance  - Up to date with Immunization except Meningococcal vaccine advised to get at the pharmacy. - Colon cancer screening discussed prefers Cologuard  - Mammogram and cervical cancer screening up to date   Family/ staff Communication: Reviewed plan of care with patient verbalized understanding   Labs/tests ordered:  - CBC with Differential/Platelet - CMP with eGFR([facility]) - TSH - Hgb A1C - Lipid panel - Hep C Antibody - HIV Antibody (routine testing w rflx)   Next Appointment : Return in about 6 months (around  09/22/2024) for medical mangement of chronic issues.SABRA   Spent 45 minutes of Face to face and non-face to face with patient  >50% time spent counseling; reviewing medical record; tests; labs; documentation and developing future plan of care.   Sherri JAYSON Plough, NP

## 2024-03-25 LAB — CBC WITH DIFFERENTIAL/PLATELET
Absolute Lymphocytes: 1873 {cells}/uL (ref 850–3900)
Absolute Monocytes: 284 {cells}/uL (ref 200–950)
Basophils Absolute: 41 {cells}/uL (ref 0–200)
Basophils Relative: 0.7 %
Eosinophils Absolute: 232 {cells}/uL (ref 15–500)
Eosinophils Relative: 4 %
HCT: 39.7 % (ref 35.0–45.0)
Hemoglobin: 13.2 g/dL (ref 11.7–15.5)
MCH: 30.1 pg (ref 27.0–33.0)
MCHC: 33.2 g/dL (ref 32.0–36.0)
MCV: 90.6 fL (ref 80.0–100.0)
MPV: 10.3 fL (ref 7.5–12.5)
Monocytes Relative: 4.9 %
Neutro Abs: 3370 {cells}/uL (ref 1500–7800)
Neutrophils Relative %: 58.1 %
Platelets: 279 Thousand/uL (ref 140–400)
RBC: 4.38 Million/uL (ref 3.80–5.10)
RDW: 13.2 % (ref 11.0–15.0)
Total Lymphocyte: 32.3 %
WBC: 5.8 Thousand/uL (ref 3.8–10.8)

## 2024-03-25 LAB — COMPLETE METABOLIC PANEL WITHOUT GFR
AG Ratio: 1.2 (calc) (ref 1.0–2.5)
ALT: 11 U/L (ref 6–29)
AST: 14 U/L (ref 10–35)
Albumin: 4 g/dL (ref 3.6–5.1)
Alkaline phosphatase (APISO): 53 U/L (ref 31–125)
BUN/Creatinine Ratio: 33 (calc) — ABNORMAL HIGH (ref 6–22)
BUN: 15 mg/dL (ref 7–25)
CO2: 27 mmol/L (ref 20–32)
Calcium: 8.9 mg/dL (ref 8.6–10.2)
Chloride: 104 mmol/L (ref 98–110)
Creat: 0.46 mg/dL — ABNORMAL LOW (ref 0.50–0.99)
Globulin: 3.3 g/dL (ref 1.9–3.7)
Glucose, Bld: 87 mg/dL (ref 65–139)
Potassium: 4.2 mmol/L (ref 3.5–5.3)
Sodium: 139 mmol/L (ref 135–146)
Total Bilirubin: 0.5 mg/dL (ref 0.2–1.2)
Total Protein: 7.3 g/dL (ref 6.1–8.1)

## 2024-03-25 LAB — LIPID PANEL
Cholesterol: 263 mg/dL — ABNORMAL HIGH (ref <200)
HDL: 63 mg/dL (ref >=50)
LDL Cholesterol (Calc): 164 mg/dL — ABNORMAL HIGH
Non-HDL Cholesterol (Calc): 200 mg/dL — ABNORMAL HIGH (ref <130)
Total CHOL/HDL Ratio: 4.2 (calc) (ref <5.0)
Triglycerides: 198 mg/dL — ABNORMAL HIGH (ref <150)

## 2024-03-25 LAB — HEPATITIS C ANTIBODY: Hepatitis C Ab: NONREACTIVE

## 2024-03-25 LAB — HIV ANTIBODY (ROUTINE TESTING W REFLEX)
HIV 1&2 Ab, 4th Generation: NONREACTIVE
HIV FINAL INTERPRETATION: NEGATIVE

## 2024-03-25 LAB — TSH: TSH: 1.49 m[IU]/L

## 2024-03-31 ENCOUNTER — Ambulatory Visit: Payer: Self-pay | Admitting: Family

## 2024-04-13 LAB — COLOGUARD: COLOGUARD: NEGATIVE

## 2024-09-22 ENCOUNTER — Ambulatory Visit: Payer: Self-pay | Admitting: Family
